# Patient Record
Sex: Male | Born: 1970
Health system: Southern US, Community
[De-identification: ages and names within clinical notes are randomized; demographics above are authoritative.]

## PROBLEM LIST (undated history)

## (undated) DIAGNOSIS — N492 Inflammatory disorders of scrotum: Secondary | ICD-10-CM

## (undated) HISTORY — DX: Inflammatory disorders of scrotum: N49.2

## (undated) HISTORY — PX: CATARACT EXTRACTION: SUR2

---

## 2006-05-20 HISTORY — PX: OTHER SURGICAL HISTORY: SHX169

## 2007-09-07 DIAGNOSIS — I10 Essential (primary) hypertension: Secondary | ICD-10-CM | POA: Insufficient documentation

## 2009-03-28 DIAGNOSIS — E663 Overweight: Secondary | ICD-10-CM | POA: Insufficient documentation

## 2009-09-08 DIAGNOSIS — D485 Neoplasm of uncertain behavior of skin: Secondary | ICD-10-CM | POA: Insufficient documentation

## 2009-09-08 DIAGNOSIS — A630 Anogenital (venereal) warts: Secondary | ICD-10-CM | POA: Insufficient documentation

## 2010-01-12 ENCOUNTER — Ambulatory Visit: Payer: Self-pay | Admitting: Family Medicine

## 2011-05-21 HISTORY — PX: ABSCESS DRAINAGE: SHX1119

## 2013-01-01 ENCOUNTER — Ambulatory Visit: Payer: Self-pay | Admitting: Family Medicine

## 2013-03-02 LAB — HM COLONOSCOPY

## 2013-05-13 ENCOUNTER — Inpatient Hospital Stay: Payer: Self-pay | Admitting: Urology

## 2013-05-13 ENCOUNTER — Ambulatory Visit: Payer: Self-pay | Admitting: Urology

## 2013-05-13 LAB — CBC WITH DIFFERENTIAL/PLATELET
Basophil %: 0.6 %
Eosinophil %: 0.2 %
HCT: 46.8 % (ref 40.0–52.0)
HGB: 15.9 g/dL (ref 13.0–18.0)
Lymphocyte #: 1.5 10*3/uL (ref 1.0–3.6)
MCH: 30.7 pg (ref 26.0–34.0)
MCHC: 33.9 g/dL (ref 32.0–36.0)
MCV: 91 fL (ref 80–100)
Monocyte #: 2.3 x10 3/mm — ABNORMAL HIGH (ref 0.2–1.0)
Monocyte %: 6.7 %
Neutrophil #: 30 10*3/uL — ABNORMAL HIGH (ref 1.4–6.5)
RDW: 12.4 % (ref 11.5–14.5)

## 2013-05-13 LAB — COMPREHENSIVE METABOLIC PANEL
Alkaline Phosphatase: 110 U/L
Anion Gap: 7 (ref 7–16)
BUN: 16 mg/dL (ref 7–18)
Bilirubin,Total: 1.9 mg/dL — ABNORMAL HIGH (ref 0.2–1.0)
Calcium, Total: 9.3 mg/dL (ref 8.5–10.1)
Chloride: 101 mmol/L (ref 98–107)
Creatinine: 1.64 mg/dL — ABNORMAL HIGH (ref 0.60–1.30)
EGFR (African American): 58 — ABNORMAL LOW
Glucose: 140 mg/dL — ABNORMAL HIGH (ref 65–99)
Osmolality: 272 (ref 275–301)
Potassium: 3.7 mmol/L (ref 3.5–5.1)
Sodium: 134 mmol/L — ABNORMAL LOW (ref 136–145)

## 2013-05-14 LAB — BASIC METABOLIC PANEL
Anion Gap: 4 — ABNORMAL LOW (ref 7–16)
Creatinine: 1.4 mg/dL — ABNORMAL HIGH (ref 0.60–1.30)
EGFR (African American): >60
EGFR (Non-African Amer.): >60
Glucose: 108 mg/dL — ABNORMAL HIGH (ref 65–99)
Potassium: 3.5 mmol/L (ref 3.5–5.1)

## 2013-05-14 LAB — CBC WITH DIFFERENTIAL/PLATELET
Basophil %: 0.3 %
HCT: 38.8 % — ABNORMAL LOW (ref 40.0–52.0)
HGB: 13.3 g/dL (ref 13.0–18.0)
Lymphocyte #: 1.7 10*3/uL (ref 1.0–3.6)
MCH: 31 pg (ref 26.0–34.0)
Monocyte %: 10.7 %
Neutrophil #: 18.8 10*3/uL — ABNORMAL HIGH (ref 1.4–6.5)
Neutrophil %: 80.9 %
Platelet: 199 10*3/uL (ref 150–440)
WBC: 23.2 10*3/uL — ABNORMAL HIGH (ref 3.8–10.6)

## 2013-05-15 LAB — CBC WITH DIFFERENTIAL/PLATELET
Basophil #: 0.1 10*3/uL (ref 0.0–0.1)
Eosinophil #: 0.2 10*3/uL (ref 0.0–0.7)
Eosinophil %: 1.4 %
HCT: 37.9 % — ABNORMAL LOW (ref 40.0–52.0)
HGB: 13 g/dL (ref 13.0–18.0)
Lymphocyte #: 1.7 10*3/uL (ref 1.0–3.6)
MCHC: 34.2 g/dL (ref 32.0–36.0)
MCV: 91 fL (ref 80–100)
Monocyte #: 2.1 x10 3/mm — ABNORMAL HIGH (ref 0.2–1.0)
Monocyte %: 12.3 %
Neutrophil #: 12.9 10*3/uL — ABNORMAL HIGH (ref 1.4–6.5)
Neutrophil %: 76 %
Platelet: 209 10*3/uL (ref 150–440)
RDW: 13 % (ref 11.5–14.5)
WBC: 17 10*3/uL — ABNORMAL HIGH (ref 3.8–10.6)

## 2013-05-15 LAB — BASIC METABOLIC PANEL
BUN: 15 mg/dL (ref 7–18)
Calcium, Total: 8.3 mg/dL — ABNORMAL LOW (ref 8.5–10.1)
Glucose: 105 mg/dL — ABNORMAL HIGH (ref 65–99)
Potassium: 3.8 mmol/L (ref 3.5–5.1)
Sodium: 136 mmol/L (ref 136–145)

## 2013-05-16 ENCOUNTER — Ambulatory Visit: Payer: Self-pay | Admitting: Urology

## 2013-05-16 LAB — CBC WITH DIFFERENTIAL/PLATELET
Basophil #: 0.1 10*3/uL (ref 0.0–0.1)
Eosinophil %: 2 %
HCT: 40.2 % (ref 40.0–52.0)
Lymphocyte %: 9.4 %
MCH: 30.3 pg (ref 26.0–34.0)
MCHC: 33.6 g/dL (ref 32.0–36.0)
Monocyte #: 1.6 x10 3/mm — ABNORMAL HIGH (ref 0.2–1.0)
Neutrophil #: 10.8 10*3/uL — ABNORMAL HIGH (ref 1.4–6.5)
Neutrophil %: 76.7 %
Platelet: 238 10*3/uL (ref 150–440)
RBC: 4.45 10*6/uL (ref 4.40–5.90)

## 2013-05-16 LAB — BASIC METABOLIC PANEL
BUN: 11 mg/dL (ref 7–18)
Calcium, Total: 8.8 mg/dL (ref 8.5–10.1)
Chloride: 104 mmol/L (ref 98–107)
Co2: 28 mmol/L (ref 21–32)
Sodium: 137 mmol/L (ref 136–145)

## 2013-05-17 LAB — CBC WITH DIFFERENTIAL/PLATELET
Eosinophil #: 0.3 10*3/uL (ref 0.0–0.7)
Eosinophil %: 2 %
HGB: 13.1 g/dL (ref 13.0–18.0)
Lymphocyte #: 1.6 10*3/uL (ref 1.0–3.6)
Lymphocyte %: 10.5 %
MCH: 29.8 pg (ref 26.0–34.0)
MCHC: 33.2 g/dL (ref 32.0–36.0)
Monocyte #: 1.6 x10 3/mm — ABNORMAL HIGH (ref 0.2–1.0)
Monocyte %: 10.8 %
Neutrophil #: 11.3 10*3/uL — ABNORMAL HIGH (ref 1.4–6.5)
Neutrophil %: 76.3 %
RBC: 4.39 10*6/uL — ABNORMAL LOW (ref 4.40–5.90)
RDW: 12.5 % (ref 11.5–14.5)

## 2013-05-17 LAB — BASIC METABOLIC PANEL
Anion Gap: 5 — ABNORMAL LOW (ref 7–16)
BUN: 11 mg/dL (ref 7–18)
Co2: 28 mmol/L (ref 21–32)
Creatinine: 1.03 mg/dL (ref 0.60–1.30)
EGFR (Non-African Amer.): >60
Potassium: 4.2 mmol/L (ref 3.5–5.1)

## 2013-05-18 LAB — WOUND CULTURE

## 2013-05-21 LAB — WOUND CULTURE

## 2013-05-24 DIAGNOSIS — N492 Inflammatory disorders of scrotum: Secondary | ICD-10-CM

## 2013-05-24 HISTORY — DX: Inflammatory disorders of scrotum: N49.2

## 2013-11-12 LAB — BASIC METABOLIC PANEL
BUN: 12 mg/dL (ref 4–21)
CREATININE: 1 mg/dL (ref 0.6–1.3)
Glucose: 94 mg/dL
Potassium: 4.3 mmol/L (ref 3.4–5.3)
Sodium: 138 mmol/L (ref 137–147)

## 2013-11-12 LAB — CBC AND DIFFERENTIAL
HCT: 47 % (ref 41–53)
Hemoglobin: 16.2 g/dL (ref 13.5–17.5)
Platelets: 206 10*3/uL (ref 150–399)
WBC: 8.2 10^3/mL

## 2013-11-12 LAB — HEPATIC FUNCTION PANEL
ALT: 32 U/L (ref 10–40)
AST: 26 U/L (ref 14–40)

## 2013-11-12 LAB — TSH: TSH: 1.59 u[IU]/mL (ref 0.41–5.90)

## 2013-12-07 ENCOUNTER — Ambulatory Visit: Payer: Self-pay | Admitting: General Surgery

## 2013-12-07 ENCOUNTER — Ambulatory Visit: Payer: Self-pay | Admitting: Family Medicine

## 2013-12-07 ENCOUNTER — Encounter: Payer: Self-pay | Admitting: *Deleted

## 2013-12-07 ENCOUNTER — Ambulatory Visit (INDEPENDENT_AMBULATORY_CARE_PROVIDER_SITE_OTHER): Payer: 59 | Admitting: General Surgery

## 2013-12-07 ENCOUNTER — Other Ambulatory Visit: Payer: Self-pay | Admitting: General Surgery

## 2013-12-07 ENCOUNTER — Encounter: Payer: Self-pay | Admitting: General Surgery

## 2013-12-07 VITALS — BP 120/82 | HR 76 | Temp 97.4°F | Resp 14 | Ht 72.0 in | Wt 287.0 lb

## 2013-12-07 DIAGNOSIS — K3580 Unspecified acute appendicitis: Secondary | ICD-10-CM

## 2013-12-07 HISTORY — PX: APPENDECTOMY: SHX54

## 2013-12-07 LAB — CBC WITH DIFFERENTIAL/PLATELET
BASOS PCT: 1.7 %
Basophil #: 0.2 10*3/uL — ABNORMAL HIGH (ref 0.0–0.1)
Eosinophil #: 0.2 10*3/uL (ref 0.0–0.7)
Eosinophil %: 1.9 %
HCT: 50.2 % (ref 40.0–52.0)
HGB: 16.4 g/dL (ref 13.0–18.0)
LYMPHS ABS: 2.2 10*3/uL (ref 1.0–3.6)
LYMPHS PCT: 18.6 %
MCH: 29.9 pg (ref 26.0–34.0)
MCHC: 32.8 g/dL (ref 32.0–36.0)
MCV: 91 fL (ref 80–100)
MONO ABS: 1 x10 3/mm (ref 0.2–1.0)
MONOS PCT: 8.4 %
NEUTROS ABS: 8.1 10*3/uL — AB (ref 1.4–6.5)
Neutrophil %: 69.4 %
PLATELETS: 184 10*3/uL (ref 150–440)
RBC: 5.49 10*6/uL (ref 4.40–5.90)
RDW: 12.7 % (ref 11.5–14.5)
WBC: 11.6 10*3/uL — ABNORMAL HIGH (ref 3.8–10.6)

## 2013-12-07 LAB — COMPREHENSIVE METABOLIC PANEL
ALBUMIN: 3.9 g/dL (ref 3.4–5.0)
Alkaline Phosphatase: 90 U/L
Anion Gap: 8 (ref 7–16)
BUN: 12 mg/dL (ref 7–18)
Bilirubin,Total: 1.1 mg/dL — ABNORMAL HIGH (ref 0.2–1.0)
Calcium, Total: 9.1 mg/dL (ref 8.5–10.1)
Chloride: 102 mmol/L (ref 98–107)
Co2: 27 mmol/L (ref 21–32)
Creatinine: 1.28 mg/dL (ref 0.60–1.30)
EGFR (African American): >60
Glucose: 89 mg/dL (ref 65–99)
Osmolality: 273 (ref 275–301)
Potassium: 4.1 mmol/L (ref 3.5–5.1)
SGOT(AST): 25 U/L (ref 15–37)
SGPT (ALT): 33 U/L (ref 12–78)
Sodium: 137 mmol/L (ref 136–145)
TOTAL PROTEIN: 8.4 g/dL — AB (ref 6.4–8.2)

## 2013-12-07 NOTE — Patient Instructions (Addendum)
Laparoscopic Appendectomy Appendectomy is surgery to remove the appendix. Laparoscopic surgery uses several small cuts (incisions) instead of one large incision. Laparoscopic surgery offers a shorter recovery time and less discomfort. LET YOUR CAREGIVER KNOW ABOUT:  Allergies to food or medicine.  Medicines taken, including vitamins, dietary supplements, herbs, eyedrops, over-the-counter medicines, and creams.  Use of steroids (by mouth or creams).  Previous problems with anesthetics or numbing medicines.  History of bleeding problems or blood clots.  Previous surgery.  Other health problems, including diabetes, heart problems, lung problems, and kidney problems.  Possibility of pregnancy, if this applies. RISKS AND COMPLICATIONS  Infection. A germ starts growing in the wound. This can usually be treated with antibiotics. In some cases, the wound will need to be opened and cleaned.  Bleeding.  Damage to other organs.  Sores (abscesses).  Chronic pain at the incision sites. This is defined as pain that lasts for more than 3 months.  Blood clots in the legs that may rarely travel to the lungs.  Infection in the lungs (pneumonia). BEFORE THE PROCEDURE Appendectomy is usually performed immediately after an inflamed appendix (appendicitis) is diagnosed. No preparation is necessary ahead of this procedure. PROCEDURE  You will be given medicine that makes you sleep (general anesthetic). After you are asleep, a flexible tube (catheter) may be inserted into your bladder to drain your urine during surgery. The tube is removed before you wake up after surgery. When you are asleep, carbondioxide gas will be used to inflate your abdomen. This will allow your surgeon to see inside your abdomen and perform your surgery. Three small incisions will be made in your abdomen. Your surgeon will insert a thin, lighted tube (laparoscope) through one of the incisions. Your surgeon will look through the  laparoscope while performing the surgery. Other tools will be inserted through the other incisions. Laparoscopic procedures may not be appropriate when:  There is major scarring from a previous surgery.  The patient has bleeding disorders.  A pregnancy is near term.  There are other conditions which make the laparoscopic procedure impossible, such as an advanced infection or a ruptured appendix. If your surgeon feels it is not safe to continue with the laparoscopic procedure, he or she will perform an open surgery instead. This gives the surgeon a larger view and more space to work. Open surgery requires a longer recovery time. After your appendix is removed, your incisions will be closed with stitches (sutures) or skin adhesive. AFTER THE PROCEDURE You will be taken to a recovery room. When the anesthesia has worn off, you will be returned to your hospital room. You will be given pain medicines to keep you comfortable. Ask your caregiver how long your hospital stay will be. Document Released: 12/19/2003 Document Revised: 07/29/2011 Document Reviewed: 11/13/2010 [facility] Patient Information 2015 [place], [place]. This information is not intended to replace advice given to you by your health care provider. Make sure you discuss any questions you have with your health care provider.  Patient is scheduled for surgery at [facility] on 12/07/13. He is to report to [facility] day surgery after leaving the office today. Patient is aware of instructions.

## 2013-12-07 NOTE — Progress Notes (Signed)
Patient ID: Travis Abbott, male   DOB: [birth year], 43 y.o.   MRN: [id number]  Chief Complaint  Patient presents with  . Abdominal Pain    HPI Travis Abbott is a 43 y.o. male. here today for abdominal pain. Patient was seen in Dr. Caryn Section office yesterday. He had a ct scan done 12/07/13.  Patient states he has been having abdominal pain for since yesterday morning in her right lower quadrant.He first thought it was gas.  The patient experienced discomfort generally in the abdomen and then it localized in the right lower quadrant. HPI  Past Medical History  Diagnosis Date  . Hypertension     Past Surgical History  Procedure Laterality Date  . Abscess drainage  2013    No family history on file.  Social History History  Substance Use Topics  . Smoking status: Former Smoker -- 1.00 packs/day for 10 years    Types: Cigarettes  . Smokeless tobacco: Former Systems developer  . Alcohol Use: Yes    Allergies  Allergen Reactions  . Losartan Potassium     headaches    Current Outpatient Prescriptions  Medication Sig Dispense Refill  . aspirin 325 MG tablet Take 325 mg by mouth every 6 (six) hours as needed.      . candesartan (ATACAND) 16 MG tablet Take 16 mg by mouth daily.      . fluticasone (FLONASE) 50 MCG/ACT nasal spray Place into both nostrils daily.      . indomethacin (INDOCIN) 50 MG capsule Take 50 mg by mouth 3 (three) times daily as needed.      . loratadine (CLARITIN) 10 MG tablet Take 10 mg by mouth daily.      . Vitamin D, Ergocalciferol, (DRISDOL) 50000 UNITS CAPS capsule Take 50,000 Units by mouth every 7 (seven) days.       No current facility-administered medications for this visit.    Review of Systems Review of Systems  Constitutional: Negative.   Respiratory: Negative.   Cardiovascular: Negative.   Gastrointestinal: Positive for abdominal pain. Negative for nausea, vomiting, diarrhea, constipation, blood in stool, abdominal distention, anal bleeding and rectal  pain.    Blood pressure 120/82, pulse 76, temperature 97.4 F (36.3 C), resp. rate 14, height 6' (1.829 m), weight 287 lb (130.182 kg).  Physical Exam Physical Exam  Constitutional: He is oriented to person, place, and time. He appears well-developed and well-nourished.  Eyes: Conjunctivae are normal. No scleral icterus.  Neck: Neck supple.  Cardiovascular: Normal rate, regular rhythm and normal heart sounds.   Pulmonary/Chest: Effort normal and breath sounds normal.  Abdominal: Soft. Normal appearance and bowel sounds are normal. There is no hepatomegaly. There is tenderness in the right lower quadrant.  Neurological: He is alert and oriented to person, place, and time.  Skin: Skin is warm and dry.    Data Reviewed CT of the abdomen and pelvis, CBC/competence metabolic panel. Case reviewed with PCP.  Assessment    Acute appendicitis.    Plan    Indications for operative resection were reviewed. Plans for a laparoscopic resection with the possibility of an open procedure were reviewed.    Patient is scheduled for surgery at [facility] on 12/07/13. He is to report to [facility] day surgery after leaving the office today. Patient is aware of instructions.   PCP: Nolene Bernheim 12/08/2013, 1:10 PM

## 2013-12-08 ENCOUNTER — Encounter: Payer: Self-pay | Admitting: General Surgery

## 2013-12-08 DIAGNOSIS — K3580 Unspecified acute appendicitis: Secondary | ICD-10-CM | POA: Insufficient documentation

## 2013-12-09 LAB — PATHOLOGY REPORT

## 2013-12-13 ENCOUNTER — Encounter: Payer: Self-pay | Admitting: General Surgery

## 2013-12-14 ENCOUNTER — Encounter: Payer: Self-pay | Admitting: General Surgery

## 2013-12-14 ENCOUNTER — Ambulatory Visit (INDEPENDENT_AMBULATORY_CARE_PROVIDER_SITE_OTHER): Payer: Self-pay | Admitting: General Surgery

## 2013-12-14 VITALS — BP 132/74 | HR 76 | Resp 14 | Ht 72.0 in | Wt 279.0 lb

## 2013-12-14 DIAGNOSIS — K3580 Unspecified acute appendicitis: Secondary | ICD-10-CM

## 2013-12-14 NOTE — Progress Notes (Signed)
Patient ID: Travis Abbott, male   DOB: 08-Jan-1971, 43 y.o.   MRN: [id number]  Chief Complaint  Patient presents with  . Routine Post Op    appendix    HPI Travis Abbott is a 43 y.o. male here today for his post op laparoscopic appendectomy completed on 12/07/13. Patient states he is doing well. He has noted some abdominal discomfort with epigastric distress when eating greasy hamburgers or onions. No difficulty with bowel function. No urinary problems. HPI  Past Medical History  Diagnosis Date  . Hypertension     Past Surgical History  Procedure Laterality Date  . Abscess drainage  2013  . Appendectomy  12/07/13    No family history on file.  Social History History  Substance Use Topics  . Smoking status: Former Smoker -- 1.00 packs/day for 10 years    Types: Cigarettes  . Smokeless tobacco: Former Systems developer  . Alcohol Use: Yes    Allergies  Allergen Reactions  . Losartan Potassium     headaches    Current Outpatient Prescriptions  Medication Sig Dispense Refill  . aspirin 325 MG tablet Take 325 mg by mouth every 6 (six) hours as needed.      . candesartan (ATACAND) 16 MG tablet Take 16 mg by mouth daily.      . fluticasone (FLONASE) 50 MCG/ACT nasal spray Place into both nostrils daily.      . indomethacin (INDOCIN) 50 MG capsule Take 50 mg by mouth 3 (three) times daily as needed.      . loratadine (CLARITIN) 10 MG tablet Take 10 mg by mouth daily.      . Vitamin D, Ergocalciferol, (DRISDOL) 50000 UNITS CAPS capsule Take 50,000 Units by mouth every 7 (seven) days.       No current facility-administered medications for this visit.    Review of Systems Review of Systems  Constitutional: Negative.   Respiratory: Negative.   Cardiovascular: Negative.     Blood pressure 132/74, pulse 76, resp. rate 14, height 6' (1.829 m), weight 279 lb (126.554 kg).  Physical Exam Physical Exam  Constitutional: He is oriented to person, place, and time. He appears  well-developed and well-nourished.  Cardiovascular: Normal rate, regular rhythm and normal heart sounds.   Pulmonary/Chest: Effort normal and breath sounds normal.  Abdominal: Soft. Normal appearance and bowel sounds are normal.  Port sites look clean and healing well.   Neurological: He is alert and oriented to person, place, and time.  Skin: Skin is warm and dry.    Data Reviewed Pathology showed evidence of acute appendicitis. No malignancy.  Assessment    Doing well status post appendectomy.  Mild dietary intolerance will likely resolve with time.     Plan    Care was strenuous activity was reviewed. Careful lifting technique demonstrated. Followup examination as needed.      PCP: Nolene Bernheim 12/14/2013, 9:21 PM

## 2013-12-14 NOTE — Patient Instructions (Signed)
Patient to return as needed. 

## 2013-12-20 ENCOUNTER — Ambulatory Visit: Payer: 59 | Admitting: General Surgery

## 2014-09-09 NOTE — H&P (Signed)
Subjective/Chief Complaint Progressive Scrotal Swelling   History of Present Illness 44 y.o. DWM s/p Bilateral Scrotal Vasectomy 05/07/2013 by Dr. Edrick Oh for desired sterilization. Developed progressive scrotal edema/pain on 05/09/2013. Pain is described as sharp and pulsatile (9/10, constant) requiring oxycodone 27m po q6hrs for relief.  Pt noted yellow discharge in his underwear since 05/10/2013.  Feels a little "dehydrated" due to not wanting to get up to get something to drink due to the discomfort.  Denies F/C, dysuria, freq/urg, N/V.  [facility]  Pt was afebrile with a tachycardia (pulse 126), but nL BP.  WBC elevated at 34.1k with 88% neutrophils. Creatinine elevated at 1.64.  Scrotal [place] thickening of the scrotal wall consistent with edema - no identifiable abscess, no intrascrotal masses, nL testes with tiny left epididymal cyst without hyperemia.   Past Medical Health Hypertension, Allergies   Past Med/Surgical Hx:  Hypertension:   Vasectomy:   ALLERGIES:  No Known Allergies:   HOME MEDICATIONS: Medication Instructions Status  candesartan 16 mg oral tablet 1 tab(s) orally once a day Active  acetaminophen-oxyCODONE 325 mg-5 mg oral tablet 1 tab(s) orally every 6 hours Active    Medications Pt reports taking 2 oxycodone every 6 hrs. Pt reports loratidine qDay   Family and Social History:  Family History Hypertension  Denies Prostate Cancer   Social History negative tobacco, negative ETOH, Former 1.5 ppd smoker x 12 yrs - d/c'd x 8 yrs   Place of Living Home   Review of Systems:  Subjective/Chief Complaint Scrotal swelling and pain   Fever/Chills No   Cough No   Sputum No   Abdominal Pain No   Diarrhea No   Constipation Yes  last BM 2 days ago   Nausea/Vomiting No   SOB/DOE No   Chest Pain No   Dysuria No   Tolerating Diet Yes   Medications/Allergies Reviewed Medications/Allergies reviewed   Physical  Exam:  GEN well developed, well nourished, no acute distress   HEENT pink conjunctivae, hearing intact to voice, dry oral mucosa, Oropharynx clear, good dentition, [place]/AT, anicteric, EOMI   NECK supple  No masses  thyroid not tender  trachea midline   RESP normal resp effort  clear BS  no use of accessory muscles   CARD regular rate  no murmur  no carotid bruits  No LE edema  no JVD  no Rub   ABD denies tenderness  no liver/spleen enlargement  no hernia  soft  normal BS  no Adominal Mass   GU nL circ phallus, marked scrotal edema/erythema, tender, with small amount of expressable purulence from the scrotal incision sites b/L   LYMPH negative neck, no inguinal adenopathy   EXTR negative edema   SKIN No rashes, No ulcers, skin turgor good, mild erythema extending from the scrotum to the mons with left tenderness   NEURO negative rigidity, negative tremor, follows commands, non-focal   PSYCH A+O to time, place, person, good insight, pleasant and cooperative   Lab Results: Hepatic:  25-Dec-14 10:21   Bilirubin, Total  1.9  Alkaline Phosphatase 110 (45-117 NOTE: New Reference Range 04/09/13)  SGPT (ALT) 23  SGOT (AST) 17  Total Protein, Serum  8.5  Albumin, Serum 3.5  Routine Chem:  25-Dec-14 10:21   Glucose, Serum  140  BUN 16  Creatinine (comp)  1.64  Sodium, Serum  134  Potassium, Serum 3.7  Chloride, Serum 101  CO2, Serum 26  Calcium (Total), Serum  9.3  Osmolality (calc) 272  eGFR (African American)  58  eGFR (Non-African American)  50 (eGFR values <51m/min/1.73 m2 may be an indication of chronic kidney disease (CKD). Calculated eGFR is useful in patients with stable renal function. The eGFR calculation will not be reliable in acutely ill patients when serum creatinine is changing rapidly. It is not useful in  patients on dialysis. The eGFR calculation may not be applicable to patients at the low and high extremes of body sizes, pregnant women, and vegetarians.)   Anion Gap 7  Routine Hem:  25-Dec-14 10:21   WBC (CBC)  34.1  RBC (CBC) 5.17  Hemoglobin (CBC) 15.9  Hematocrit (CBC) 46.8  Platelet Count (CBC) 231  MCV 91  MCH 30.7  MCHC 33.9  RDW 12.4  Neutrophil % 88.0  Lymphocyte % 4.5  Monocyte % 6.7  Eosinophil % 0.2  Basophil % 0.6  Neutrophil #  30.0  Lymphocyte # 1.5  Monocyte #  2.3  Eosinophil # 0.1  Basophil #  0.2 (Result(s) reported on 13 May 2013 at 11:28AM.)   Radiology Results: [place]    25-Dec-14 12:15, UKoreaTesticle  [place]Testicle  REASON FOR EXAM:    pain redness, severe swelling, hi white count  COMMENTS:       PROCEDURE: [place] - UKoreaTESTICULAR W/DOPPLER  - May 13 2013 12:15PM     CLINICAL DATA:  Trickle scrotal pain and swelling with elevated  white blood cell count.    EXAM:  SCROTAL ULTRASOUND    DOPPLER ULTRASOUND OF THE TESTICLES    TECHNIQUE:  Complete ultrasound examination of the testicles, epididymis, and  other scrotal structures was performed. Color and spectral Doppler  ultrasound were also utilized to evaluate blood flow to the  testicles.    COMPARISON:  None.    FINDINGS:  Right testicle    Measurements: 4.4 x 3.1 x 3.1 cm.. No mass or microlithiasis  visualized.    Left testicle    Measurements: 4.2 x 3.1 x 2.8 cm. No mass or microlithiasis  visualized.    Right epididymis:  Normal in size and appearance.    Left epididymis: There is a tiny left epididymal cyst measuring 3.6  x 3.3 x 3.9 cm.    Hydrocele:  None visualized.    Varicocele:  None visualized.    Pulsed Doppler interrogation of both testesdemonstrates the testes  and the epididymal structures demonstrate normal vascularity.     IMPRESSION:  1. The testes and epididymal structures are normal in appearance.  There is a tiny epididymal cyst on the left. There is no hyperemia  of these structures. No intra scrotal mass is demonstrated and there  is no hydrocele or varicocele.  2. There is clinical thickening and  erythema of the scrotum. The  ultrasound appearance of the scrotal wall is that of edema. No  discrete scrotal abscess is demonstrated .      Electronically Signed    By: David  JMartinique   On: 05/13/2013 12:17         Verified By: DAVID A. JMartinique M.D., MD  LabUnknown:  PACS Image    Assessment/Admission Diagnosis 1. Scrotal cellulitis s/p b/L Scrotal Vasectomy 2. AKI - due to dehydration due to persistent severe pain inhibiting po fluid intake 3. HTN - well-controlled on meds   Plan Admit for IV Abx's/IV hydration  1. Unasyn 3g IV q6hrs 2. D5 1/2 NS at 1223mhr 3. Strict I's/O's 4. Scrotal Elevation 5. Warm  compresses to the scrotal incision sites   Electronic Signatures: Darcella Cheshire (MD)  (Signed 25-Dec-14 14:50)  Authored: CHIEF COMPLAINT and HISTORY, PAST MEDICAL/SURGIAL HISTORY, ALLERGIES, HOME MEDICATIONS, OTHER MEDICATIONS, FAMILY AND SOCIAL HISTORY, REVIEW OF SYSTEMS, PHYSICAL EXAM, LABS, Radiology, ASSESSMENT AND PLAN   Last Updated: 25-Dec-14 14:50 by Darcella Cheshire (MD)

## 2014-09-10 NOTE — Discharge Summary (Signed)
PATIENT NAME:  Travis Abbott, Travis Abbott MR#:  [id number] DATE OF BIRTH:  October 19, 1970  DATE OF ADMISSION:  05/13/2013  DATE OF DISCHARGE:  05/18/2013  PRINCIPAL DIAGNOSIS: Scrotal abscess.   PROCEDURE: Incision and drainage of scrotal abscess.   INDICATION: The patient is a 44 year old gentleman who underwent recent vasectomy. He developed superficial infection at the incision sites bilaterally. There was an approximate 5-day delay between the onset of symptoms and his contact with the office. He had significant swelling, pain and discomfort of the scrotal area. He had poor oral intake as a result of the discomfort. He ultimately presented to the Emergency Room for further evaluation. He was noted to have a serum creatinine elevated to 1.64. He was also noted to have white blood cell count elevation to 34.1. An ultrasound was obtained in the Emergency Room, demonstrating no evidence of abscess. He was admitted for IV antibiotic therapy and hydration.   HOSPITAL COURSE: Travis Abbott was admitted through the Emergency Room on 05/13/2013 with elevated white blood cell count and superficial cellulitis of the scrotum. There was no evidence of abscess at that time. He was placed on Unasyn IV antibiotic therapy with IV hydration. He had moderate reduction in his white blood cell count to 23.2 by his second day of hospitalization. On examination, a small amount of purulence was expressible from the incision sites bilaterally. There was no definitive evidence of abscess at that time. He was continued on IV antibiotic therapy. On December 27, there was an identifiable area suspicious for abscess, predominantly in the right hemiscrotum. He underwent repeat scrotal ultrasound, demonstrating a sizable abscess. He underwent incision and drainage by Dr. Sharlette Dense. Packing was then applied. He had continued reduction of his white blood cell count to 14.8. He had improvement in his serum creatinine to 1.03. He had significant improvement  in pain and discomfort. He had some reduction in the scrotal erythema. However, moderate edema persisted. He was able to ambulate without difficulty. He was tolerating a general diet. He remained afebrile, vital signs stable throughout the remainder of his hospitalization. He was instructed as to the proper technique for wound care with wet-to-dry saline packing and covering with clean, dry gauze. He continued to improve. Subsequent cultures demonstrated findings consistent with staph aureus. It was sensitive to most antibiotics. He was subsequently discharged to home on 05/18/2013, with instructions to continue with the dressing changes on a 3-time-a-day basis. He was discharged on a 10-day course of Bactrim double strength twice daily. He was also discharged with Percocet 1 every 6 hours as needed for pain. He is to follow up in one week for re-evaluation. He is to notify us if there are any further problems or questions in the interim.    ____________________________ Denice Bors Jacqlyn Larsen, MD bsc:mr D: 05/18/2013 14:50:00 ET T: 05/18/2013 19:59:44 ET JOB#: [id number]  cc: Denice Bors. Jacqlyn Larsen, MD, <Dictator> Denice Bors Leeona Mccardle MD ELECTRONICALLY SIGNED 05/24/2013 13:37

## 2014-09-10 NOTE — Op Note (Signed)
PATIENT NAME:  Travis Abbott, Travis Abbott MR#:  [id number] DATE OF BIRTH:  08/28/1970  DATE OF PROCEDURE:  12/07/2013  PREOPERATIVE DIAGNOSIS: Acute appendicitis.   POSTOPERATIVE DIAGNOSIS:  Acute appendicitis.   OPERATIVE PROCEDURE: Laparoscopic appendectomy.   SURGEON: Robert Bellow, MD.   ANESTHESIA: General endotracheal under Dr. Boston Service.   ESTIMATED BLOOD LOSS: Less than 25 mL.   CLINICAL NOTE: This 44 year old male presented with just over 24-hour history of abdominal pain. CT showed evidence of appendiceal swelling and periappendiceal inflammation. White blood cell count was mildly elevated at 11,200. Clinical exam was consistent with acute appendicitis. The patient received Invanz prior to the procedure.   OPERATIVE NOTE: The patient underwent general endotracheal anesthesia without difficulty. Hair had previously been removed from the abdomen with clippers. In Trendelenburg position, a Veress needle was placed through a transumbilical incision. After assuring intra-abdominal location with the hanging drop test, the abdomen was insufflated with CO2 at initially 10 mmHg pressure. This was later increased to 12 mm to provide better exposure. A 10 mm step port was expanded and inspection showed no evidence of injury from initial port placement. A 10 mm step port was placed in the hypogastrium and a 12 mm Xcel port placed in the left lower quadrant outside the edge of the rectus fascia. The periappendiceal tissue did show inflammation and swelling.   The appendix was freed and the bleeding noted was from the medial aspect of the mesoappendix. The appendix was divided from the base of the cecum with a blue Endo GIA cartridge. Good hemostasis was noted. The mesoappendix was divided with a white vascular cartridge. The appendix was placed in an Endo Catch bag and then while drawing it through the 12 mm port site, the bag ruptured. A  2nd bag was utilized and the appendix was extracted without  incident. After re-establishing pneumoperitoneum, a small amount of blood perhaps 10 mL or so was noted in the area of the appendix. This was irrigated with the Stryker irrigator. The mesoappendix appeared hemostatic and the area of inflammatory bleeding, near antimesenteric sail of the terminal ileum was unremarkable. Final irrigation again showed no bleeding and the 12 mm port site was then closed with an 0 PDS suture placed with transfacial suture passer. The pneumoperitoneum was released and the hypogastric port removed under direct vision. The fascia at the umbilicus was closed with an 0 PDS figure-of-eight suture. Skin incisions were closed with 4-0 Vicryl subcuticular suture. Benzoin, Steri-Strips, Telfa and Tegaderm dressings were applied.   The patient tolerated the procedure and was taken to the recovery room in stable condition.    ____________________________ Robert Bellow, MD jwb:ds D: 12/07/2013 21:43:04 ET T: 12/07/2013 21:56:55 ET JOB#: [id number]  cc: Robert Bellow, MD, <Dictator> Kirstie Peri. Caryn Section, MD Jonnatan Amedeo Kinsman MD ELECTRONICALLY SIGNED 12/15/2013 9:49

## 2014-09-10 NOTE — H&P (Signed)
PATIENT NAME:  Travis Abbott, Travis Abbott MR#:  [id number] DATE OF BIRTH:  1971-05-13  DATE OF ADMISSION:  12/07/2013  ADMISSION DIAGNOSIS: Acute appendicitis.   CLINICAL NOTE: This 44 year old male was with his usual health until approximately 7:00 a.m. yesterday morning, when he noticed the onset of diffuse abdominal pain. Over the next several hours, this settled in the right lower quadrant and has persisted since the onset. No improvement with recent bowel movement. No nausea or vomiting. No fever, chills.   CT scan completed earlier today showed evidence of thickening of the appendix and periappendiceal inflammation. White blood cell count was mildly elevated at 11,600 with a normal differential. Hemoglobin 16.4.   PHYSICAL EXAMINATION:   HEAD AND NECK: Unremarkable.  CHEST: Clear to auscultation.  CARDIAC: Regular rhythm without murmur or gallop.  ABDOMEN: Obese and soft. Normal bowel sounds. Moderate tenderness in the right lower quadrant. No peritoneal irritation, guarding or referred pain.   PAST MEDICAL HISTORY: Notable for essential hypertension.    MEDICATIONS:  Include aspirin, Atacand 16 mg daily, Flonase 15 mcg daily, indomethacin 50 mg p.o. t.i.d., Claritin 10 mg p.o. q.d., vitamin D daily.   MEDICAL ALLERGIES CONSIST OF LOSARTAN.   IMPRESSION: Acute appendicitis.   PLAN: The patient will be admitted to day surgery and undergo laparoscopic appendectomy. Plans are for post-Operating Room overnight observation.    ____________________________ Robert Bellow, MD jwb:cs D: 12/07/2013 15:38:49 ET T: 12/07/2013 16:18:06 ET JOB#: [id number]  cc: Robert Bellow, MD, <Dictator> Kirstie Peri. Caryn Section, MD Favor Amedeo Kinsman MD ELECTRONICALLY SIGNED 12/07/2013 20:11

## 2014-10-30 ENCOUNTER — Other Ambulatory Visit: Payer: Self-pay | Admitting: Family Medicine

## 2014-11-22 ENCOUNTER — Telehealth: Payer: Self-pay | Admitting: Family Medicine

## 2014-11-22 NOTE — Telephone Encounter (Signed)
Patient returned call. Patient advised as below. Patient states the cut is more like a scratch and he had the nurse at work look at it and wrap it up. Patient advised to keep the scratch clean.

## 2014-11-22 NOTE — Telephone Encounter (Signed)
Tried calling patient. No answer. Left message to call back. Last Tdap was 03/28/2009. Patient does not need to get another booster right now. How is his finger? What did he cut it on?  Does patient need to be seen for finger cut?

## 2014-11-22 NOTE — Telephone Encounter (Signed)
Pt called .  Cut finger at work.  Does not need stiches but needs to know if he needs tet. Vaccine.  Please call back and leave message at [phone]

## 2015-04-11 DIAGNOSIS — R208 Other disturbances of skin sensation: Secondary | ICD-10-CM | POA: Insufficient documentation

## 2015-04-11 DIAGNOSIS — M25519 Pain in unspecified shoulder: Secondary | ICD-10-CM | POA: Insufficient documentation

## 2015-04-11 DIAGNOSIS — J309 Allergic rhinitis, unspecified: Secondary | ICD-10-CM | POA: Insufficient documentation

## 2015-04-11 DIAGNOSIS — R809 Proteinuria, unspecified: Secondary | ICD-10-CM | POA: Insufficient documentation

## 2015-04-11 DIAGNOSIS — N529 Male erectile dysfunction, unspecified: Secondary | ICD-10-CM | POA: Insufficient documentation

## 2015-04-11 DIAGNOSIS — E559 Vitamin D deficiency, unspecified: Secondary | ICD-10-CM | POA: Insufficient documentation

## 2015-04-12 ENCOUNTER — Encounter: Payer: Self-pay | Admitting: Family Medicine

## 2015-04-12 ENCOUNTER — Ambulatory Visit (INDEPENDENT_AMBULATORY_CARE_PROVIDER_SITE_OTHER): Payer: 59 | Admitting: Family Medicine

## 2015-04-12 VITALS — BP 144/88 | HR 71 | Temp 97.9°F | Resp 16 | Ht 72.75 in | Wt 296.0 lb

## 2015-04-12 DIAGNOSIS — I10 Essential (primary) hypertension: Secondary | ICD-10-CM

## 2015-04-12 DIAGNOSIS — R202 Paresthesia of skin: Secondary | ICD-10-CM | POA: Diagnosis not present

## 2015-04-12 DIAGNOSIS — E663 Overweight: Secondary | ICD-10-CM

## 2015-04-12 DIAGNOSIS — E559 Vitamin D deficiency, unspecified: Secondary | ICD-10-CM | POA: Diagnosis not present

## 2015-04-12 DIAGNOSIS — Z86010 Personal history of colonic polyps: Secondary | ICD-10-CM | POA: Insufficient documentation

## 2015-04-12 DIAGNOSIS — Z23 Encounter for immunization: Secondary | ICD-10-CM | POA: Diagnosis not present

## 2015-04-12 DIAGNOSIS — Z0000 Encounter for general adult medical examination without abnormal findings: Secondary | ICD-10-CM

## 2015-04-12 NOTE — Progress Notes (Signed)
Patient: Travis Abbott, Male    DOB: [birth year], 44 y.o.   MRN: [id number] Visit Date: 04/12/2015  Today's Provider: Lelon Huh, MD   Chief Complaint  Patient presents with  . Annual Exam  . Hypertension    follow up  . Erectile Dysfunction    follow up   Subjective:    Annual physical exam Travis Abbott is a 44 y.o. male who presents today for health maintenance and complete physical. He feels poorly. He has sinus problems going on. He reports never exercising other than lifting on the job . He reports he is sleeping well.  -----------------------------------------------------------------  Hypertension, follow-up:  BP Readings from Last 3 Encounters:  12/14/13 132/74  12/07/13 120/82    He was last seen for hypertension 11 months ago.  BP at that visit was  150/96. Management since that visit includes no changes. He reports good compliance with treatment. He is not having side effects.  He is not exercising. He is adherent to low salt diet.   Outside blood pressures are not being checked. He is experiencing fatigue.  Patient denies chest pain, chest pressure/discomfort, claudication, dyspnea, exertional chest pressure/discomfort, irregular heart beat and lower extremity edema.   Cardiovascular risk factors include hypertension and male gender.  Use of agents associated with hypertension: none.     Weight trend: increasing steadily Wt Readings from Last 3 Encounters:  12/14/13 279 lb (126.554 kg)  12/07/13 287 lb (130.182 kg)    Current diet: in general, a "healthy" diet    ------------------------------------------------------------------------ Allergies He reports his allergies have flared up the last few weeks, is using OTC Generic flonase and is going to start on loratadine.   Neuropathy Work up last year discovered very low vitamin D. He had evaluation by Dr. Melrose Nakayama and was prescribed gabapentin and B12 supplements. Patient states numbness  and burning of feet are much better and he has not taken vitamins of gabapentin for several months.   Follow up Vitamin D Deficiency: Last office visit was 11/ months ago and no changes were made. Patient was advised to continue Vitamin D supplements 50,000units every week.  Today patient comes in stating he has been out of the Vitamin D supplements for several months. Patient declines any numbness.   Follow up Gouty Arthropathy: Current treatment includes Indomethacin as needed. Patient reports he has not had a gout flare up in several months.   Follow up Erectile Dysfunction: Last evaluation visit was 3 years ago and patient was started on Viagra. Patient states he no longer needs Viagra.   Review of Systems  Constitutional: Positive for fatigue. Negative for fever, chills and appetite change.  HENT: Positive for congestion, rhinorrhea and sinus pressure.   Eyes: Negative.   Respiratory: Negative for chest tightness, shortness of breath and wheezing.   Cardiovascular: Negative for chest pain and palpitations.  Gastrointestinal: Negative for nausea, vomiting and abdominal pain.  Endocrine: Negative.   Genitourinary: Negative.   Musculoskeletal: Negative.   Allergic/Immunologic: Negative.   Neurological: Negative.   Hematological: Negative.   Psychiatric/Behavioral: Negative.     Social History He  reports that he quit smoking about 7 years ago. His smoking use included Cigarettes. He has a 10 pack-year smoking history. He has quit using smokeless tobacco. He reports that he drinks alcohol. He reports that he does not use illicit drugs. Social History   Social History  . Marital Status: Single    Spouse Name: N/A  .  Number of Children: 1  . Years of Education: N/A   Occupational History  . Welder    Social History Main Topics  . Smoking status: Former Smoker -- 1.00 packs/day for 10 years    Types: Cigarettes    Quit date: 05/21/2007  . Smokeless tobacco: Former Systems developer  .  Alcohol Use: 0.0 oz/week    0 Standard drinks or equivalent per week     Comment: dinks 2 drinks per month  . Drug Use: No  . Sexual Activity: Not Asked   Other Topics Concern  . None   Social History Narrative    Patient Active Problem List   Diagnosis Date Noted  . History of adenomatous polyp of colon 04/12/2015  . Allergic rhinitis 04/11/2015  . ED (erectile dysfunction) of organic origin 04/11/2015  . Pain in joint, shoulder region 04/11/2015  . Burning sensation of the foot 04/11/2015  . Proteinuria 04/11/2015  . Vitamin D deficiency 04/11/2015  . Condyloma acuminatum 09/08/2009  . Neoplasm of uncertain behavior of skin 09/08/2009  . Gouty arthropathy 08/12/2009  . Overweight 03/28/2009  . Essential (primary) hypertension 09/07/2007    Past Surgical History  Procedure Laterality Date  . Abscess drainage  2013  . Appendectomy  12/07/13    Dr. Bary Castilla, [facility]  . Cataract extraction      Left eye: 03/02/2001.  Right eye: 04/07/2000  . Biopsy of left parotid mass  2008    Benign    Family History  Family Status  Relation Status Death Age  . Mother Alive   . Father Alive   . Brother Alive   . Son Alive   . Maternal Grandmother Deceased   . Maternal Grandfather Deceased   . Paternal Grandmother Deceased     died from complication of Dementia  . Paternal Grandfather Deceased    His family history includes Dementia in his paternal grandmother; Heart attack in his maternal grandfather; Hypertension in his mother; Kidney failure in his paternal grandfather; Skin cancer in his father; Stomach cancer in his maternal grandmother.    Allergies  Allergen Reactions  . Losartan Potassium     headaches    Previous Medications   CANDESARTAN (ATACAND) 16 MG TABLET    Take 1 tablet (16 mg total) by mouth daily.   INDOMETHACIN (INDOCIN) 50 MG CAPSULE    Take 50 mg by mouth 3 (three) times daily as needed.   LORATADINE (CLARITIN) 10 MG TABLET    Take 10 mg by mouth daily.     PSYLLIUM (REGULOID) 0.52 G CAPSULE    Take 2 capsules by mouth daily.   VITAMIN D, ERGOCALCIFEROL, (DRISDOL) 50000 UNITS CAPS CAPSULE    Take 50,000 Units by mouth every 7 (seven) days.    Patient Care Team: Birdie Sons, MD as PCP - General (Family Medicine) Robert Bellow, MD (General Surgery)     Objective:   Vitals: BP 144/88 mmHg  Pulse 71  Temp(Src) 97.9 F (36.6 C) (Oral)  Resp 16  Ht 6' 0.75" (1.848 m)  Wt 296 lb (134.265 kg)  BMI 39.32 kg/m2  SpO2 97%   Physical Exam   General Appearance:    Alert, cooperative, no distress, appears stated age. ebese  Head:    Normocephalic, without obvious abnormality, atraumatic  Eyes:    PERRL, conjunctiva/corneas clear, EOM's intact, fundi    benign, both eyes       Ears:    Normal TM's and external ear canals, both ears  Nose:   Nares normal, septum midline, mucosa normal, no drainage   or sinus tenderness  Throat:   Lips, mucosa, and tongue normal; teeth and gums normal  Neck:   Supple, symmetrical, trachea midline, no adenopathy;       thyroid:  No enlargement/tenderness/nodules; no carotid   bruit or JVD  Back:     Symmetric, no curvature, ROM normal, no CVA tenderness  Lungs:     Clear to auscultation bilaterally, respirations unlabored  Chest wall:    No tenderness or deformity  Heart:    Regular rate and rhythm, S1 and S2 normal, no murmur, rub   or gallop  Abdomen:     Soft, non-tender, bowel sounds active all four quadrants,    no masses, no organomegaly  Genitalia:    deferred  Rectal:    deferred  Extremities:   Extremities normal, atraumatic, no cyanosis or edema  Pulses:   2+ and symmetric all extremities  Skin:   Skin color, texture, turgor normal, no rashes or lesions  Lymph nodes:   Cervical, supraclavicular, and axillary nodes normal  Neurologic:   CNII-XII intact. Normal strength, sensation and reflexes      throughout    Depression Screen PHQ 2/9 Scores 04/12/2015  PHQ - 2 Score 0  PHQ- 9  Score 0      Assessment & Plan:     Routine Health Maintenance and Physical Exam  Exercise Activities and Dietary recommendations Goals    None      Immunization History  Administered Date(s) Administered  . Tdap 03/28/2009    Health Maintenance  Topic Date Due  . HIV Screening  03/28/1984  . INFLUENZA VACCINE  12/19/2014  . TETANUS/TDAP  03/29/2019      Discussed health benefits of physical activity, and encouraged him to engage in regular exercise appropriate for his age and condition.    --------------------------------------------------------------------  1. Annual physical exam  - Comprehensive metabolic panel - Lipid panel  2. Paresthesia of foot, unspecified laterality Much improved, but is now off of B12 and vitamin D supplements.   3. Essential (primary) hypertension Stable. Continue current medications.   - EKG 12-Lead  4. Overweight Reduce calories to lose weight  5. Vitamin D deficiency Current off of D supplements.  - VITAMIN D 25 Hydroxy (Vit-D Deficiency, Fractures)  6. Need for influenza vaccination  - Flu Vaccine QUAD 36+ mos IM

## 2015-04-12 NOTE — Patient Instructions (Signed)

## 2015-04-13 LAB — LIPID PANEL
CHOL/HDL RATIO: 4.3 ratio (ref 0.0–5.0)
Cholesterol, Total: 151 mg/dL (ref 100–199)
HDL: 35 mg/dL — ABNORMAL LOW (ref >39)
LDL CALC: 94 mg/dL (ref 0–99)
Triglycerides: 112 mg/dL (ref 0–149)
VLDL CHOLESTEROL CAL: 22 mg/dL (ref 5–40)

## 2015-04-13 LAB — COMPREHENSIVE METABOLIC PANEL
ALBUMIN: 4.4 g/dL (ref 3.5–5.5)
ALT: 42 IU/L (ref 0–44)
AST: 33 IU/L (ref 0–40)
Albumin/Globulin Ratio: 1.5 (ref 1.1–2.5)
Alkaline Phosphatase: 90 IU/L (ref 39–117)
BUN / CREAT RATIO: 12 (ref 9–20)
BUN: 14 mg/dL (ref 6–24)
Bilirubin Total: 0.6 mg/dL (ref 0.0–1.2)
CALCIUM: 9.7 mg/dL (ref 8.7–10.2)
CO2: 24 mmol/L (ref 18–29)
CREATININE: 1.2 mg/dL (ref 0.76–1.27)
Chloride: 103 mmol/L (ref 97–106)
GFR calc Af Amer: 83 mL/min/{1.73_m2} (ref >59)
GFR, EST NON AFRICAN AMERICAN: 72 mL/min/{1.73_m2} (ref >59)
GLOBULIN, TOTAL: 2.9 g/dL (ref 1.5–4.5)
Glucose: 100 mg/dL — ABNORMAL HIGH (ref 65–99)
Potassium: 5.4 mmol/L — ABNORMAL HIGH (ref 3.5–5.2)
SODIUM: 142 mmol/L (ref 136–144)
Total Protein: 7.3 g/dL (ref 6.0–8.5)

## 2015-04-13 LAB — VITAMIN D 25 HYDROXY (VIT D DEFICIENCY, FRACTURES): Vit D, 25-Hydroxy: 23.1 ng/mL — ABNORMAL LOW (ref 30.0–100.0)

## 2015-05-22 IMAGING — CT CT ABD-PELV W/ CM
2 of 5 series · 16 of 46 positions shown, 18 images · IV contrast (isovue)
Comparison: None.

CLINICAL DATA: Right lower quadrant and left lower quadrant pain
for 1 day, abdominal bloating

EXAM:
CT ABDOMEN AND PELVIS WITH CONTRAST
TECHNIQUE: Multidetector CT imaging of the abdomen and pelvis was performed
using the standard protocol following bolus administration of
intravenous contrast.
CONTRAST:  125 cc Isovue 370

[Series 2: routine abd pel with · axial · 0.95mm/px · z∈[-1080,-590]mm · 13 of 110 slices shown, 15 images]
[im 6/110  soft-tissue]
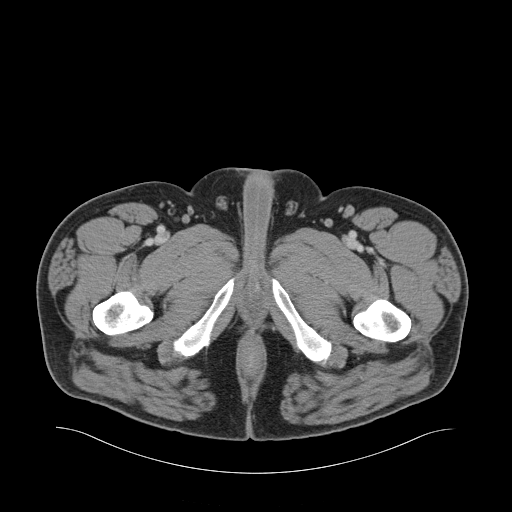
[im 6/110  bone]
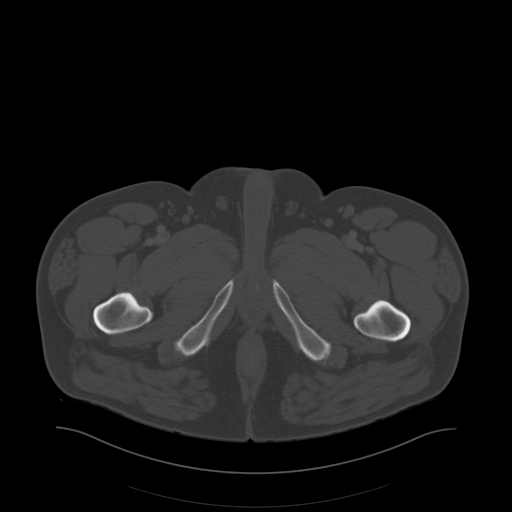
[im 17/110  soft-tissue]
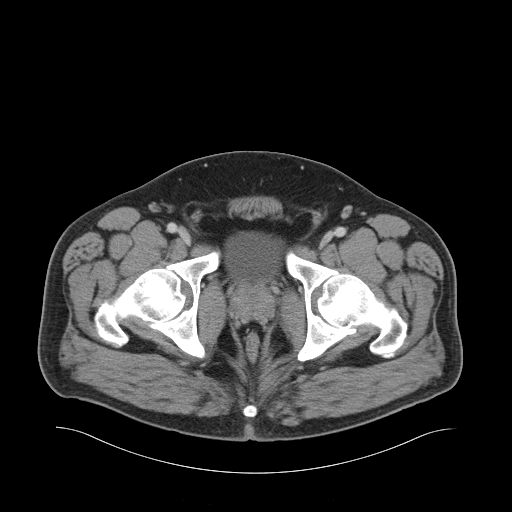
[im 22/110  soft-tissue]
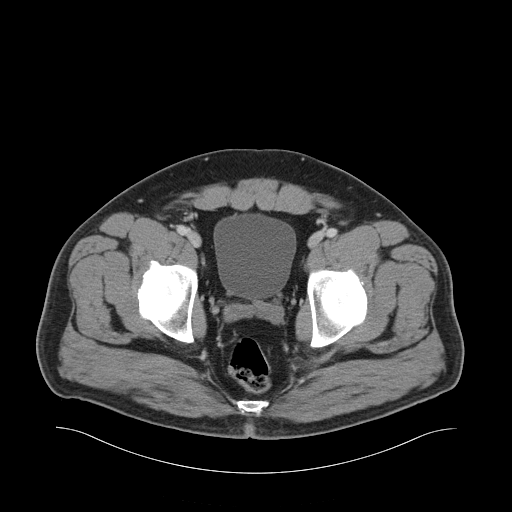
[im 33/110  soft-tissue]
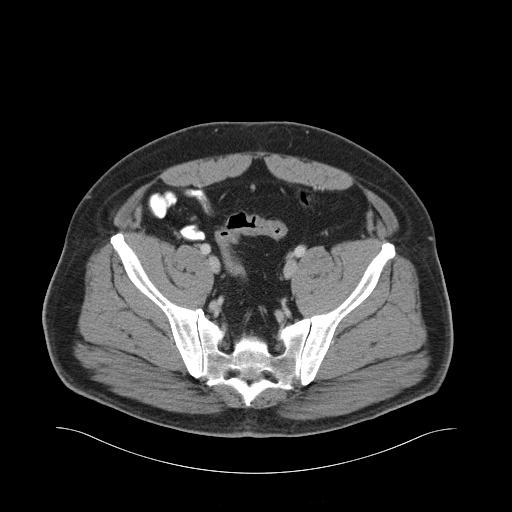
[im 39/110  soft-tissue]
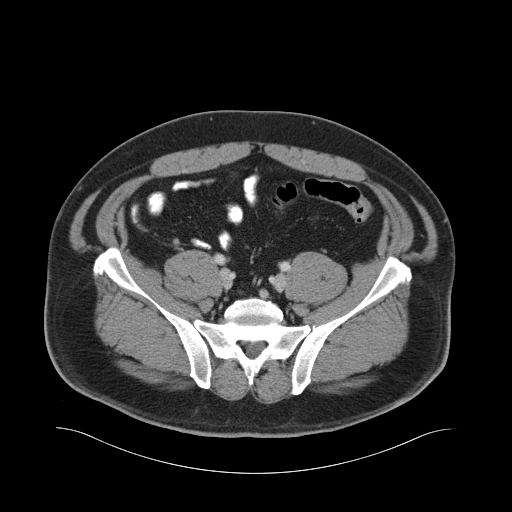
[im 50/110  soft-tissue]
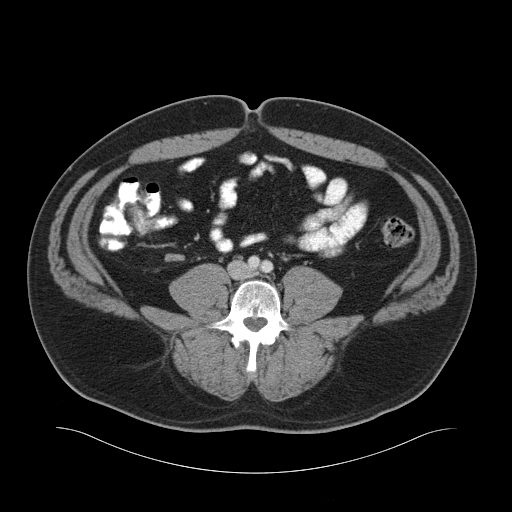
[im 55/110  soft-tissue]
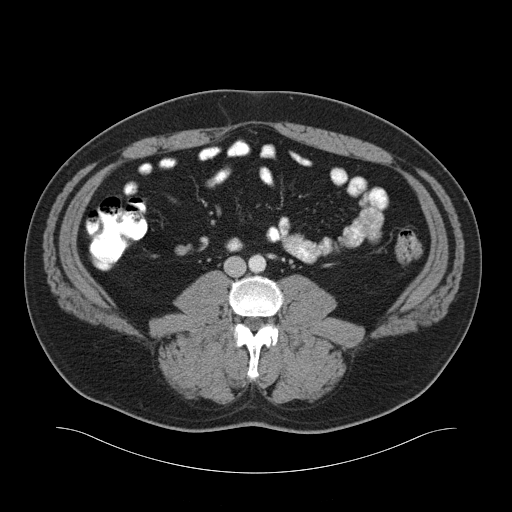
[im 60/110  soft-tissue]
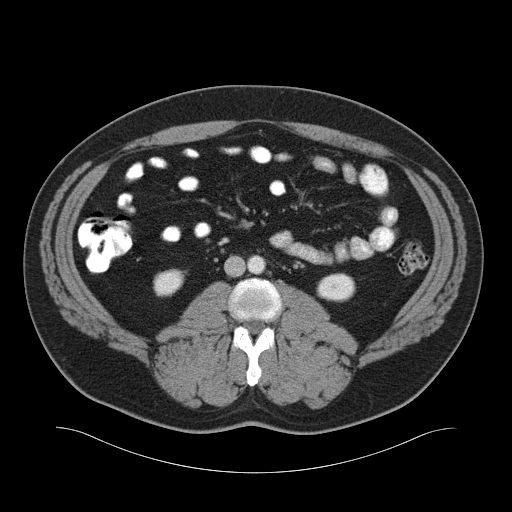
[im 71/110  soft-tissue]
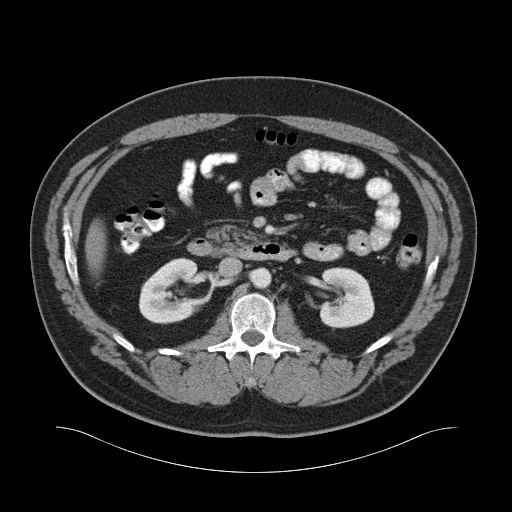
[im 71/110  bone]
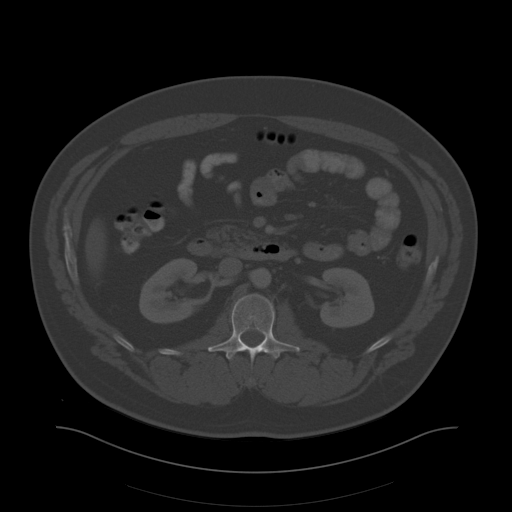
[im 77/110  soft-tissue]
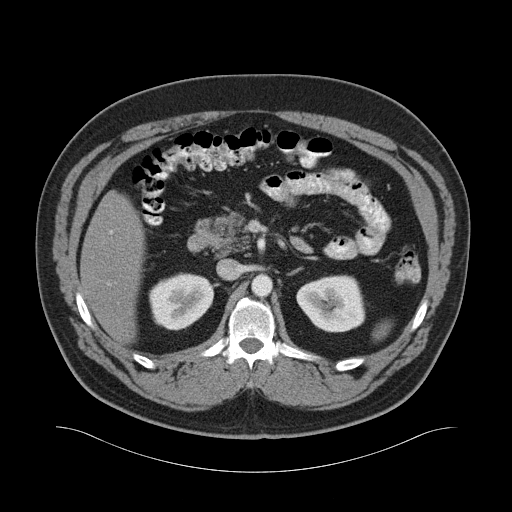
[im 88/110  soft-tissue]
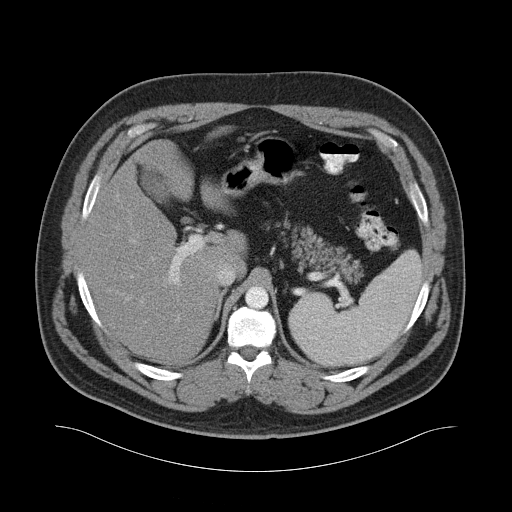
[im 93/110  soft-tissue]
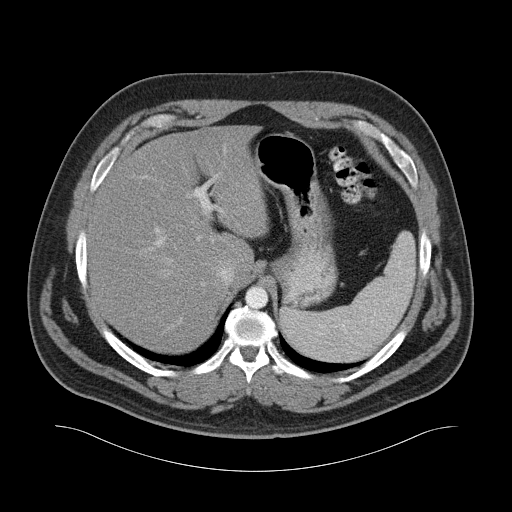
[im 104/110  soft-tissue]
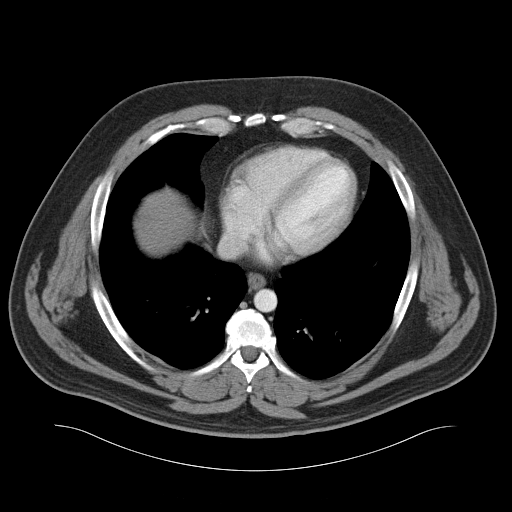

[Series 6: cor routine abd pel with · coronal · 0.93mm/px · 3 of 175 slices shown]
[im 59/175  soft-tissue]
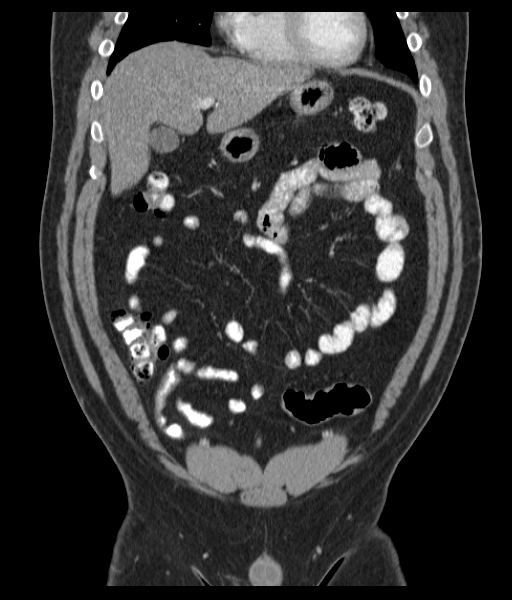
[im 78/175  soft-tissue]
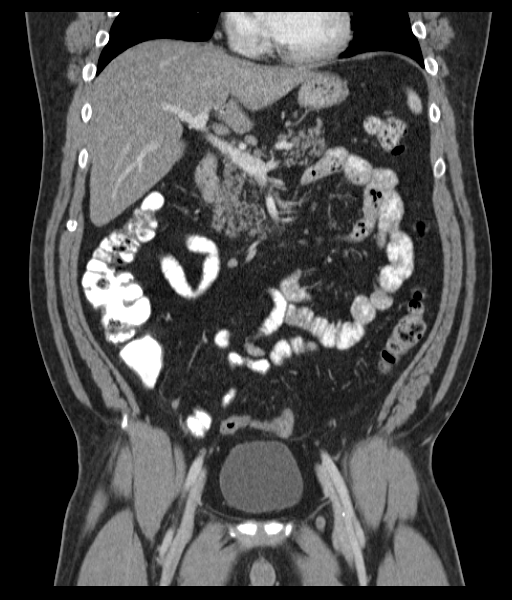
[im 97/175  soft-tissue]
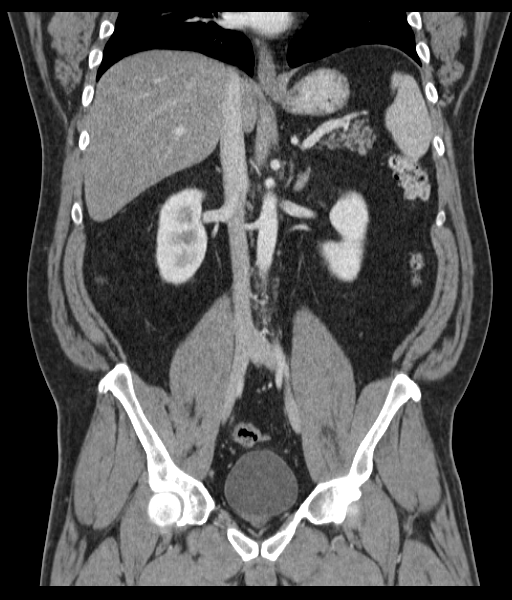

[16 of 46 positions shown; findings below may reference images not displayed]

FINDINGS: The lung bases are clear. The liver is rather low in attenuation
which may indicate fatty infiltration. Correlation with liver
function tests is recommended. No calcified gallstones are seen. The
pancreas is normal in size and the pancreatic duct is not dilated.
The adrenal glands and spleen are unremarkable. The stomach is
decompressed. The kidneys enhance with no calculus or mass and on
delayed images the pelvocaliceal systems are unremarkable with a
probable tiny cyst emanating from the upper pole of the left kidney.
The proximal ureters are normal in caliber. The abdominal aorta is
normal in caliber. No adenopathy is seen.

The appendix is slightly prominent within the right lower quadrant
measuring up to 10 mm in diameter with a small amount of
periappendiceal strandiness consistent with acute appendicitis. Mild
edema is present at the appendiceal orifice at the base of the
cecum. The urinary bladder is unremarkable. The prostate is normal
in size. The colon is decompressed. The terminal ileum is
unremarkable. The lumbar vertebrae are in normal alignment with
normal intervertebral disc spaces.
IMPRESSION: 1. Prominent appendix with mild periappendiceal strandiness very
suspicious for acute appendicitis. No complicating features are
seen.
2. Suspect mild fatty infiltration of the liver. Correlate with
LFTs.

## 2015-06-08 ENCOUNTER — Encounter: Payer: Self-pay | Admitting: Family Medicine

## 2015-06-08 ENCOUNTER — Ambulatory Visit (INDEPENDENT_AMBULATORY_CARE_PROVIDER_SITE_OTHER): Payer: 59 | Admitting: Family Medicine

## 2015-06-08 ENCOUNTER — Other Ambulatory Visit: Payer: Self-pay

## 2015-06-08 VITALS — BP 138/84 | HR 83 | Temp 98.2°F | Resp 16 | Wt 302.4 lb

## 2015-06-08 DIAGNOSIS — J40 Bronchitis, not specified as acute or chronic: Secondary | ICD-10-CM

## 2015-06-08 MED ORDER — BENZONATATE 100 MG PO CAPS: ORAL_CAPSULE | ORAL | Status: DC

## 2015-06-08 MED ORDER — AZITHROMYCIN 250 MG PO TABS: ORAL_TABLET | ORAL | Status: DC

## 2015-06-08 NOTE — Progress Notes (Signed)
Subjective:     Patient ID: Travis Abbott, male   DOB: 09-04-1970, 45 y.o.   MRN: [id number]  HPI  Chief Complaint  Patient presents with  . URI    symptoms X 2.5 weeks. Patient reports he has tried Mucinex with mild relief.   States sinuses are clear and he is left with spells of coughing where he will lose his breath: "I've had walking pneumonia before." Tdap in 2010.   Review of Systems  Constitutional: Negative for fever and chills.       Objective:   Physical Exam  Constitutional: He appears well-developed and well-nourished. No distress.  Ears: T.M's intact without inflammation Throat: no tonsillar enlargement or exudate Neck: no cervical adenopathy Lungs: clear     Assessment:    1. Bronchitis - azithromycin (ZITHROMAX) 250 MG tablet; Two pills the first day then one pill daily for 4 days  Dispense: 6 tablet; Refill: 0 - benzonatate (TESSALON) 100 MG capsule; One or two 3 x day as needed for cough  Dispense: 21 capsule; Refill: 0    Plan:    Encourage fluid intake.

## 2015-06-08 NOTE — Patient Instructions (Signed)
Continue with increased fluids.

## 2015-07-31 ENCOUNTER — Other Ambulatory Visit: Payer: Self-pay | Admitting: Family Medicine

## 2015-09-18 ENCOUNTER — Encounter: Payer: Self-pay | Admitting: Family Medicine

## 2015-09-18 ENCOUNTER — Ambulatory Visit (INDEPENDENT_AMBULATORY_CARE_PROVIDER_SITE_OTHER): Payer: 59 | Admitting: Family Medicine

## 2015-09-18 VITALS — BP 130/80 | HR 80 | Temp 98.6°F | Resp 16 | Ht 72.75 in | Wt 300.0 lb

## 2015-09-18 DIAGNOSIS — H6983 Other specified disorders of Eustachian tube, bilateral: Secondary | ICD-10-CM | POA: Diagnosis not present

## 2015-09-18 MED ORDER — FLUTICASONE PROPIONATE 50 MCG/ACT NA SUSP: 2.0000 | Freq: Every day | NASAL | Status: AC

## 2015-09-18 NOTE — Progress Notes (Signed)
Patient: Travis Abbott Male    DOB: Aug 05, 1970   45 y.o.   MRN: [id number] Visit Date: 09/18/2015  Today's Provider: Lelon Huh, MD   Chief Complaint  Patient presents with  . Ear Fullness   Subjective:      Bilateral ear congestion for 2 weeks. Patient had sinus congestion 2 weeks ago and then wears ears stopped-up. He states he had a bad cold with nasal and sinus congestion before his ears started bothering him. All other cold symptoms have resolved. Ears are not at all painful, but he feels like he is in a well.    Ear Fullness  There is pain in both ears. This is a new problem. The current episode started 1 to 4 weeks ago (2 weeks). The problem occurs constantly. The problem has been unchanged. There has been no fever. The patient is experiencing no pain. Pertinent negatives include no abdominal pain, coughing, diarrhea, ear discharge, headaches, hearing loss, neck pain, rash, rhinorrhea, sore throat or vomiting. Treatments tried: mucinex. The treatment provided moderate relief. There is no history of a chronic ear infection, hearing loss or a tympanostomy tube.      Allergies  Allergen Reactions  . Losartan Potassium     headaches   Previous Medications   CANDESARTAN (ATACAND) 16 MG TABLET    Take 1 tablet (16 mg total) by mouth daily.   CHOLECALCIFEROL (VITAMIN D3) 5000 UNITS TABS    Take by mouth.   INDOMETHACIN (INDOCIN) 50 MG CAPSULE    TAKE ONE CAPSULE BY MOUTH 3 TIMES A DAY AS NEEDED FOR PAIN   LORATADINE (CLARITIN) 10 MG TABLET    Take 10 mg by mouth daily.   PSYLLIUM (REGULOID) 0.52 G CAPSULE    Take 2 capsules by mouth daily.   ZYCLARA PUMP [id number] % CREA    APPLICATION APPLY ON THE SKIN DAILY    Review of Systems  Constitutional: Negative for fever, chills and appetite change.  HENT: Negative for ear discharge, hearing loss, rhinorrhea and sore throat.        Bilateral ear congestion x2 weeks  Respiratory: Negative for cough, chest tightness, shortness  of breath and wheezing.   Cardiovascular: Negative for chest pain and palpitations.  Gastrointestinal: Negative for nausea, vomiting, abdominal pain and diarrhea.  Musculoskeletal: Negative for neck pain.  Skin: Negative for rash.  Neurological: Negative for headaches.    Social History  Substance Use Topics  . Smoking status: Former Smoker -- 1.00 packs/day for 10 years    Types: Cigarettes    Quit date: 05/21/2007  . Smokeless tobacco: Former Systems developer  . Alcohol Use: 0.0 oz/week    0 Standard drinks or equivalent per week     Comment: dinks 2 drinks per month   Objective:   BP 130/80 mmHg  Pulse 80  Temp(Src) 98.6 F (37 C) (Oral)  Resp 16  Ht 6' 0.75" (1.848 m)  Wt 300 lb (136.079 kg)  BMI 39.85 kg/m2  SpO2 98%  Physical Exam  General Appearance:    Alert, cooperative, no distress  HENT:   bilateral TM fluid noted, neck without nodes, throat normal without erythema or exudate, sinuses nontender and nasal mucosa pale and congested  Eyes:    PERRL, conjunctiva/corneas clear, EOM's intact       Lungs:     Clear to auscultation bilaterally, respirations unlabored  Heart:    Regular rate and rhythm  Neurologic:   Awake, alert, oriented x  3. No apparent focal neurological           defect.            Assessment & Plan:     1. Eustachian tube dysfunction, bilateral He has been out of Flonase for quite a while. Will start back on fluticasone nasal spray.  - fluticasone (FLONASE) 50 MCG/ACT nasal spray; Place 2 sprays into both nostrils daily. Reported on 09/18/2015  Dispense: 16 g; Refill: 1  Call if any fevers or ear pain.       Lelon Huh, MD  [facility]

## 2015-11-09 ENCOUNTER — Other Ambulatory Visit: Payer: Self-pay | Admitting: Family Medicine

## 2016-07-17 ENCOUNTER — Encounter: Payer: Self-pay | Admitting: Family Medicine

## 2016-07-17 ENCOUNTER — Ambulatory Visit (INDEPENDENT_AMBULATORY_CARE_PROVIDER_SITE_OTHER): Payer: 59 | Admitting: Family Medicine

## 2016-07-17 VITALS — BP 126/74 | HR 80 | Temp 98.7°F | Resp 16 | Ht 71.0 in | Wt 287.0 lb

## 2016-07-17 DIAGNOSIS — K921 Melena: Secondary | ICD-10-CM | POA: Diagnosis not present

## 2016-07-17 DIAGNOSIS — Z0000 Encounter for general adult medical examination without abnormal findings: Secondary | ICD-10-CM | POA: Diagnosis not present

## 2016-07-17 DIAGNOSIS — I10 Essential (primary) hypertension: Secondary | ICD-10-CM | POA: Diagnosis not present

## 2016-07-17 DIAGNOSIS — E559 Vitamin D deficiency, unspecified: Secondary | ICD-10-CM

## 2016-07-17 DIAGNOSIS — Z86010 Personal history of colonic polyps: Secondary | ICD-10-CM

## 2016-07-17 DIAGNOSIS — Z800 Family history of malignant neoplasm of digestive organs: Secondary | ICD-10-CM | POA: Diagnosis not present

## 2016-07-17 NOTE — Patient Instructions (Signed)
It is recommended to engage in 150 minutes of moderate exercise every week.   

## 2016-07-17 NOTE — Progress Notes (Signed)
Patient: Travis Abbott, Male    DOB: 1971/03/15, 46 y.o.   MRN: [id number] Visit Date: 07/17/2016  Today's Provider: Lelon Huh, MD   Chief Complaint  Patient presents with  . Annual Exam  . Hypertension   Subjective:    Annual physical exam Travis Abbott is a 46 y.o. male who presents today for health maintenance and complete physical. He feels well. He reports exercising 3 times a week. He reports he is sleeping well.    Hypertension, follow-up:  BP Readings from Last 3 Encounters:  07/17/16 126/74  09/18/15 130/80  06/08/15 138/84    He was last seen for hypertension 1 years ago.  BP at that visit was 144/88. Management since that visit includes no changes. He reports good compliance with treatment. He is not having side effects.  He is exercising. He is adherent to low salt diet.   Outside blood pressures are not being checked. Patient denies chest pressure/discomfort, exertional chest pressure/discomfort and lower extremity edema.     Weight trend: decreasing steadily Wt Readings from Last 3 Encounters:  07/17/16 287 lb (130.2 kg)  09/18/15 300 lb (136.1 kg)  06/08/15 (!) 302 lb 6.4 oz (137.2 kg)    Current diet: well balanced   Blood in stool He reports that over the last few months he has noticed blood in stool a couple of times each week. Prior to that he noticed blood rarely, every two or three months. There is no pain, burning or rectal itching associated with blood, and has had no constipation or other change in bowel habits. His last colonoscopy by Dr. Tiffany Kocher was in Oct 2014 and was advised to repeat in 5 years due to adenomatous polyps.   Review of Systems  Constitutional: Negative.   HENT: Negative.   Eyes: Negative.   Respiratory: Negative.   Gastrointestinal: Positive for blood in stool and rectal pain.       Has hemorrhoids.   Endocrine: Negative.   Genitourinary: Negative.   Musculoskeletal: Negative.   Skin: Negative.     Allergic/Immunologic: Negative.   Neurological: Negative.   Hematological: Negative.  Negative for adenopathy.  Psychiatric/Behavioral: Negative.     Social History      He  reports that he quit smoking about 9 years ago. His smoking use included Cigarettes. He has a 10.00 pack-year smoking history. He has quit using smokeless tobacco. He reports that he drinks alcohol. He reports that he does not use drugs.       Social History   Social History  . Marital status: Single    Spouse name: N/A  . Number of children: 1  . Years of education: N/A   Occupational History  . Welder    Social History Main Topics  . Smoking status: Former Smoker    Packs/day: 1.00    Years: 10.00    Types: Cigarettes    Quit date: 05/21/2007  . Smokeless tobacco: Former Systems developer  . Alcohol use 0.0 oz/week     Comment: dinks 2 drinks per month  . Drug use: No  . Sexual activity: Not Asked   Other Topics Concern  . None   Social History Narrative  . None    Past Medical History:  Diagnosis Date  . Abscess of scrotum 05/24/2013     Patient Active Problem List   Diagnosis Date Noted  . History of adenomatous polyp of colon 04/12/2015  . Allergic rhinitis 04/11/2015  . ED (  erectile dysfunction) of organic origin 04/11/2015  . Pain in joint, shoulder region 04/11/2015  . Burning sensation of the foot 04/11/2015  . Proteinuria 04/11/2015  . Vitamin D deficiency 04/11/2015  . Condyloma acuminatum 09/08/2009  . Neoplasm of uncertain behavior of skin 09/08/2009  . Gouty arthropathy 08/12/2009  . Overweight 03/28/2009  . Essential (primary) hypertension 09/07/2007    Past Surgical History:  Procedure Laterality Date  . ABSCESS DRAINAGE  2013  . APPENDECTOMY  12/07/13   Dr. Bary Castilla, [facility]  . Biopsy of left parotid mass  2008   Benign  . CATARACT EXTRACTION     Left eye: 03/02/2001.  Right eye: 04/07/2000    Family History        Family Status  Relation Status  . Mother Alive  . Father  Alive  . Brother Alive  . Son Alive  . Maternal Grandmother Deceased  . Maternal Grandfather Deceased  . Paternal Grandmother Deceased   died from complication of Dementia  . Paternal Grandfather Deceased        His family history includes Dementia in his paternal grandmother; Heart attack in his maternal grandfather; Hypertension in his mother; Kidney failure in his paternal grandfather; Skin cancer in his father; Stomach cancer in his maternal grandmother.     Allergies  Allergen Reactions  . Losartan Potassium     headaches     Current Outpatient Prescriptions:  .  candesartan (ATACAND) 16 MG tablet, TAKE 1 TABLET BY MOUTH DAILY, Disp: 90 tablet, Rfl: 3 .  fluticasone (FLONASE) 50 MCG/ACT nasal spray, Place 2 sprays into both nostrils daily. Reported on 09/18/2015, Disp: 16 g, Rfl: 1 .  indomethacin (INDOCIN) 50 MG capsule, TAKE ONE CAPSULE BY MOUTH 3 TIMES A DAY AS NEEDED FOR PAIN, Disp: 30 capsule, Rfl: 3 .  loratadine (CLARITIN) 10 MG tablet, Take 10 mg by mouth daily., Disp: , Rfl:  .  psyllium (REGULOID) 0.52 G capsule, Take 2 capsules by mouth daily., Disp: , Rfl:  .  ZYCLARA PUMP [id number] % CREA, APPLICATION APPLY ON THE SKIN DAILY, Disp: , Rfl: 2 .  Cholecalciferol (VITAMIN D3) 5000 units TABS, Take by mouth., Disp: , Rfl:    Patient Care Team: Birdie Sons, MD as PCP - General (Family Medicine) Robert Bellow, MD (General Surgery)      Objective:   Vitals: BP 126/74 (BP Location: Right Arm, Patient Position: Sitting, Cuff Size: Large)   Pulse 80   Temp 98.7 F (37.1 C)   Resp 16   Ht 5\' 11"  (1.803 m)   Wt 287 lb (130.2 kg)   BMI 40.03 kg/m    Physical Exam   General Appearance:    Alert, cooperative, no distress, appears stated age, obsee  Head:    Normocephalic, without obvious abnormality, atraumatic  Eyes:    PERRL, conjunctiva/corneas clear, EOM's intact, fundi    benign, both eyes       Ears:    Normal TM's and external ear canals, both ears  Nose:    Nares normal, septum midline, mucosa normal, no drainage   or sinus tenderness  Throat:   Lips, mucosa, and tongue normal; teeth and gums normal  Neck:   Supple, symmetrical, trachea midline, no adenopathy;       thyroid:  No enlargement/tenderness/nodules; no carotid   bruit or JVD  Back:     Symmetric, no curvature, ROM normal, no CVA tenderness  Lungs:     Clear to auscultation bilaterally, respirations unlabored  Chest wall:    No tenderness or deformity  Heart:    Regular rate and rhythm, S1 and S2 normal, no murmur, rub   or gallop  Abdomen:     Soft, non-tender, bowel sounds active all four quadrants,    no masses, no organomegaly  Genitalia:    deferred  Rectal:    deferred  Extremities:   Extremities normal, atraumatic, no cyanosis or edema  Pulses:   2+ and symmetric all extremities  Skin:   Skin color, texture, turgor normal, no rashes or lesions  Lymph nodes:   Cervical, supraclavicular, and axillary nodes normal  Neurologic:   CNII-XII intact. Normal strength, sensation and reflexes      throughout    Depression Screen PHQ 2/9 Scores 07/17/2016 04/12/2015  PHQ - 2 Score 0 0  PHQ- 9 Score 0 0      Assessment & Plan:     Routine Health Maintenance and Physical Exam  Exercise Activities and Dietary recommendations Goals    None      Immunization History  Administered Date(s) Administered  . Influenza,inj,Quad PF,36+ Mos 04/12/2015  . Tdap 03/28/2009    Health Maintenance  Topic Date Due  . HIV Screening  03/28/1984  . INFLUENZA VACCINE  12/19/2015  . COLONOSCOPY  03/02/2018  . TETANUS/TDAP  03/29/2019     Discussed health benefits of physical activity, and encouraged him to engage in regular exercise appropriate for his age and condition.     1. Annual physical exam  - Comprehensive metabolic panel - Lipid panel  2. Essential (primary) hypertension Well controlled.  Continue current medications.   - Lipid panel - EKG 12-Lead  3. Vitamin  D deficiency Is currently off of vitamin d supplements, but had no adverse effects when he was taking them.  - VITAMIN D 25 Hydroxy (Vit-D Deficiency, Fractures)  4. History of adenomatous polyp of colon  - Ambulatory referral to Gastroenterology  5. Blood in stool Considering family history of colon cancer and history of adenomatous polyps, will refer back to Dr. Tiffany Kocher to consider follow up colonoscopy.  - Ambulatory referral to Gastroenterology  6. Family history of colon cancer  - Ambulatory referral to Gastroenterology   Lelon Huh, MD  [facility]

## 2016-07-18 LAB — LIPID PANEL
CHOL/HDL RATIO: 5.6 ratio — AB (ref 0.0–5.0)
CHOLESTEROL TOTAL: 157 mg/dL (ref 100–199)
HDL: 28 mg/dL — ABNORMAL LOW (ref >39)
LDL Calculated: 93 mg/dL (ref 0–99)
TRIGLYCERIDES: 180 mg/dL — AB (ref 0–149)
VLDL Cholesterol Cal: 36 mg/dL (ref 5–40)

## 2016-07-18 LAB — COMPREHENSIVE METABOLIC PANEL
A/G RATIO: 1.6 (ref 1.2–2.2)
ALK PHOS: 93 IU/L (ref 39–117)
ALT: 30 IU/L (ref 0–44)
AST: 22 IU/L (ref 0–40)
Albumin: 4.6 g/dL (ref 3.5–5.5)
BUN/Creatinine Ratio: 12 (ref 9–20)
BUN: 13 mg/dL (ref 6–24)
Bilirubin Total: 0.8 mg/dL (ref 0.0–1.2)
CO2: 21 mmol/L (ref 18–29)
Calcium: 9.6 mg/dL (ref 8.7–10.2)
Chloride: 100 mmol/L (ref 96–106)
Creatinine, Ser: 1.12 mg/dL (ref 0.76–1.27)
GFR calc Af Amer: 90 mL/min/{1.73_m2} (ref >59)
GFR calc non Af Amer: 78 mL/min/{1.73_m2} (ref >59)
GLOBULIN, TOTAL: 2.9 g/dL (ref 1.5–4.5)
Glucose: 95 mg/dL (ref 65–99)
POTASSIUM: 4.4 mmol/L (ref 3.5–5.2)
SODIUM: 140 mmol/L (ref 134–144)
Total Protein: 7.5 g/dL (ref 6.0–8.5)

## 2016-07-18 LAB — VITAMIN D 25 HYDROXY (VIT D DEFICIENCY, FRACTURES): Vit D, 25-Hydroxy: 30.7 ng/mL (ref 30.0–100.0)

## 2016-07-18 NOTE — Progress Notes (Signed)
Advised  ED 

## 2016-08-10 ENCOUNTER — Other Ambulatory Visit: Payer: Self-pay | Admitting: Family Medicine

## 2016-09-17 LAB — HM COLONOSCOPY

## 2016-10-02 ENCOUNTER — Encounter: Payer: Self-pay | Admitting: Family Medicine

## 2016-10-07 ENCOUNTER — Encounter: Payer: Self-pay | Admitting: Family Medicine

## 2016-11-11 ENCOUNTER — Other Ambulatory Visit: Payer: Self-pay | Admitting: Family Medicine

## 2016-12-06 ENCOUNTER — Telehealth: Payer: Self-pay | Admitting: Family Medicine

## 2016-12-06 MED ORDER — SILDENAFIL CITRATE 100 MG PO TABS: 50.0000 mg | ORAL_TABLET | Freq: Every day | ORAL | 11 refills | Status: DC | PRN

## 2016-12-06 NOTE — Telephone Encounter (Signed)
Pt contacted office for refill request on the following medications:   Viagra. Pt is requesting the generic if possible.  [facility].  TR#[id number]

## 2017-01-09 DIAGNOSIS — D179 Benign lipomatous neoplasm, unspecified: Secondary | ICD-10-CM | POA: Diagnosis not present

## 2017-02-18 ENCOUNTER — Ambulatory Visit (INDEPENDENT_AMBULATORY_CARE_PROVIDER_SITE_OTHER): Payer: 59 | Admitting: Family Medicine

## 2017-02-18 ENCOUNTER — Encounter: Payer: Self-pay | Admitting: Family Medicine

## 2017-02-18 VITALS — BP 148/88 | HR 79 | Temp 98.1°F | Wt 293.2 lb

## 2017-02-18 DIAGNOSIS — B0059 Other herpesviral disease of eye: Secondary | ICD-10-CM | POA: Diagnosis not present

## 2017-02-18 MED ORDER — VALACYCLOVIR HCL 500 MG PO TABS: 500.0000 mg | ORAL_TABLET | Freq: Two times a day (BID) | ORAL | 0 refills | Status: DC

## 2017-02-18 NOTE — Progress Notes (Signed)
Patient: Travis Abbott Male    DOB: 05-08-1971   46 y.o.   MRN: [id number] Visit Date: 02/18/2017  Today's Provider: Vernie Murders, PA   Chief Complaint  Patient presents with  . Eye Pain   Subjective:    Eye Pain   The right (under lower lid) eye is affected. This is a new problem. Episode onset: Wednesday. The problem occurs constantly. The problem has been gradually improving. There was no injury mechanism. The patient is experiencing no pain (discomfort). Associated symptoms include an eye discharge and itching. Associated symptoms comments: Scaly area under right lower eye lid  . He has tried eye drops for the symptoms. The treatment provided no relief.   Past Medical History:  Diagnosis Date  . Abscess of scrotum 05/24/2013   Past Surgical History:  Procedure Laterality Date  . ABSCESS DRAINAGE  2013  . APPENDECTOMY  12/07/13   Dr. Bary Castilla, [facility]  . Biopsy of left parotid mass  2008   Benign  . CATARACT EXTRACTION     Left eye: 03/02/2001.  Right eye: 04/07/2000   Family History  Problem Relation Age of Onset  . Hypertension Mother   . Skin cancer Father   . Stomach cancer Maternal Grandmother   . Heart attack Maternal Grandfather   . Dementia Paternal Grandmother   . Kidney failure Paternal Grandfather    Allergies  Allergen Reactions  . Losartan Potassium     headaches     Previous Medications   CANDESARTAN (ATACAND) 16 MG TABLET    TAKE 1 TABLET BY MOUTH DAILY   CHOLECALCIFEROL (VITAMIN D3) 5000 UNITS TABS    Take by mouth.   FLUTICASONE (FLONASE) 50 MCG/ACT NASAL SPRAY    Place 2 sprays into both nostrils daily. Reported on 09/18/2015   INDOMETHACIN (INDOCIN) 50 MG CAPSULE    TAKE ONE CAPSULE BY MOUTH 3 TIMES A DAY AS NEEDED FOR PAIN   LORATADINE (CLARITIN) 10 MG TABLET    Take 10 mg by mouth daily.   PSYLLIUM (REGULOID) 0.52 G CAPSULE    Take 2 capsules by mouth daily.   SILDENAFIL (VIAGRA) 100 MG TABLET    Take 0.5-1 tablets (50-100 mg total) by mouth  daily as needed for erectile dysfunction.    Review of Systems  Constitutional: Negative.   Eyes: Positive for pain, discharge and itching.  Respiratory: Negative.   Cardiovascular: Negative.     Social History  Substance Use Topics  . Smoking status: Former Smoker    Packs/day: 1.00    Years: 10.00    Types: Cigarettes    Quit date: 05/21/2007  . Smokeless tobacco: Former Systems developer  . Alcohol use 0.0 oz/week     Comment: drinks 2 drinks per month   Objective:   BP (!) 148/88 (BP Location: Right Arm, Patient Position: Sitting, Cuff Size: Normal)   Pulse 79   Temp 98.1 F (36.7 C) (Oral)   Wt 293 lb 3.2 oz (133 kg)   SpO2 98%   BMI 40.89 kg/m   Physical Exam  Constitutional: He is oriented to person, place, and time. He appears well-developed and well-nourished. No distress.  HENT:  Head: Normocephalic and atraumatic.  Right Ear: Hearing normal.  Left Ear: Hearing normal.  Nose: Nose normal.  Mouth/Throat: Oropharynx is clear and moist.  Eyes: Conjunctivae and lids are normal. Right eye exhibits no discharge. Left eye exhibits no discharge. No scleral icterus.  Pinpoint scabs with 2-3 mm area of pinkness surrounding.  Neck:  Neck supple.  Pulmonary/Chest: Effort normal. No respiratory distress.  Musculoskeletal: Normal range of motion.  Lymphadenopathy:    He has no cervical adenopathy.  Neurological: He is alert and oriented to person, place, and time.  Skin: Skin is intact. No lesion and no rash noted.  Psychiatric: He has a normal mood and affect. His speech is normal and behavior is normal. Thought content normal.      Assessment & Plan:     1. Herpes simplex infection of right eyelid Onset 3-4 days ago with irritation to the right upper and lower eyelid. No vision changes. Suspect herpes simplex/"cold sore" virus to eyelid. - valACYclovir (VALTREX) 500 MG tablet; Take 1 tablet (500 mg total) by mouth 2 (two) times daily.  Dispense: 14 tablet; Refill: 0

## 2017-02-18 NOTE — Patient Instructions (Signed)
Cold Sore A cold sore, also called a fever blister, is a skin infection that is caused by a virus. This infection causes small, fluid-filled sores to form inside of the mouth or on the lips, gums, nose, chin, or cheeks. Cold sores can spread to other parts of the body, such as the eyes or fingers. Cold sores can be spread or passed from person to person (contagious) until the sores crust over completely. Cold sores can be spread through close contact, such as kissing or sharing a drinking glass. Follow these instructions at home: Medicines  Take or apply over-the-counter and prescription medicines only as told by your doctor.  Use a cotton-tip swab to apply creams or gels to your sores. Sore Care  Do not touch the sores or pick the scabs.  Wash your hands often. Do not touch your eyes without washing your hands first.  Keep the sores clean and dry.  If directed, apply ice to the sores:  Put ice in a plastic bag.  Place a towel between your skin and the bag.  Leave the ice on for 20 minutes, 2-3 times per day. Lifestyle  Do not kiss, have oral sex, or share personal items until your sores heal.  Eat a soft, bland diet. Avoid eating hot, cold, or salty foods. These can hurt your mouth.  Use a straw if it hurts to drink out of a glass.  Avoid the sun and limit your stress if these things trigger outbreaks. If sun causes cold sores, apply sunscreen on your lips before being out in the sun. Contact a doctor if:  You have symptoms for more than two weeks.  You have pus coming from the sores.  You have redness that is spreading.  You have pain or irritation in your eye.  You get sores on your genitals.  Your sores do not heal within two weeks.  You get cold sores often. Get help right away if:  You have a fever and your symptoms suddenly get worse.  You have a headache and confusion. This information is not intended to replace advice given to you by your health care  provider. Make sure you discuss any questions you have with your health care provider. Document Released: 11/05/2011 Document Revised: 10/12/2015 Document Reviewed: 02/24/2015 Elsevier Interactive Patient Education  2018 Elsevier Inc.  

## 2017-05-05 DIAGNOSIS — R208 Other disturbances of skin sensation: Secondary | ICD-10-CM | POA: Diagnosis not present

## 2017-05-05 DIAGNOSIS — L02213 Cutaneous abscess of chest wall: Secondary | ICD-10-CM | POA: Diagnosis not present

## 2017-05-05 DIAGNOSIS — L0390 Cellulitis, unspecified: Secondary | ICD-10-CM | POA: Diagnosis not present

## 2017-05-05 DIAGNOSIS — R234 Changes in skin texture: Secondary | ICD-10-CM | POA: Diagnosis not present

## 2017-06-16 ENCOUNTER — Ambulatory Visit (INDEPENDENT_AMBULATORY_CARE_PROVIDER_SITE_OTHER): Payer: 59 | Admitting: Family Medicine

## 2017-06-16 ENCOUNTER — Encounter: Payer: Self-pay | Admitting: Family Medicine

## 2017-06-16 VITALS — BP 120/84 | HR 92 | Temp 98.4°F | Resp 16 | Wt 293.0 lb

## 2017-06-16 DIAGNOSIS — J329 Chronic sinusitis, unspecified: Secondary | ICD-10-CM | POA: Diagnosis not present

## 2017-06-16 DIAGNOSIS — M940 Chondrocostal junction syndrome [Tietze]: Secondary | ICD-10-CM | POA: Diagnosis not present

## 2017-06-16 DIAGNOSIS — R002 Palpitations: Secondary | ICD-10-CM

## 2017-06-16 MED ORDER — AMOXICILLIN 500 MG PO CAPS: 1000.0000 mg | ORAL_CAPSULE | Freq: Two times a day (BID) | ORAL | 0 refills | Status: AC

## 2017-06-16 NOTE — Progress Notes (Signed)
Patient: Travis Abbott Male    DOB: 01/19/1971   47 y.o.   MRN: [id number] Visit Date: 06/16/2017  Today's Provider: Lelon Huh, MD   Chief Complaint  Patient presents with  . Cough   Subjective:    Patient has had cough and sinus pressure for 5 days. Cough is productive. Other symptoms include sinus pressure, sinus pain, chest congestion, increased heart rate. Patient has been taking coricidin otc with no relief.   Cough  This is a new problem. The current episode started in the past 7 days (2 days). The problem has been gradually worsening. The cough is productive of sputum. Associated symptoms include chills, nasal congestion and postnasal drip. Pertinent negatives include no chest pain, ear congestion, ear pain, fever, headaches, heartburn, hemoptysis, myalgias, rash, rhinorrhea, sore throat, shortness of breath, sweats, weight loss or wheezing. The symptoms are aggravated by lying down. Treatments tried: coricidin. The treatment provided no relief. His past medical history is significant for bronchitis.   Also complains he started having chest discomfort on January 8th associated with coughing and taking deep breaths which has greatly improved  States having episodes of heart racing on 06/10/2017, no assocciated chest pains or dyspnea. States that he feels like his heart is racing now.   He also reports pain in left upper chest for last week, which is mostly resolve now except if he coughs. No dyspnea. No known injury.    Allergies  Allergen Reactions  . Losartan Potassium     headaches     Current Outpatient Medications:  .  candesartan (ATACAND) 16 MG tablet, TAKE 1 TABLET BY MOUTH DAILY, Disp: 90 tablet, Rfl: 3 .  Cholecalciferol (VITAMIN D3) 5000 units TABS, Take by mouth., Disp: , Rfl:  .  fluticasone (FLONASE) 50 MCG/ACT nasal spray, Place 2 sprays into both nostrils daily. Reported on 09/18/2015, Disp: 16 g, Rfl: 1 .  indomethacin (INDOCIN) 50 MG capsule,  TAKE ONE CAPSULE BY MOUTH 3 TIMES A DAY AS NEEDED FOR PAIN, Disp: 30 capsule, Rfl: 5 .  loratadine (CLARITIN) 10 MG tablet, Take 10 mg by mouth daily., Disp: , Rfl:  .  psyllium (REGULOID) 0.52 G capsule, Take 2 capsules by mouth daily., Disp: , Rfl:  .  sildenafil (VIAGRA) 100 MG tablet, Take 0.5-1 tablets (50-100 mg total) by mouth daily as needed for erectile dysfunction., Disp: 5 tablet, Rfl: 11 .  valACYclovir (VALTREX) 500 MG tablet, Take 1 tablet (500 mg total) by mouth 2 (two) times daily., Disp: 14 tablet, Rfl: 0  Review of Systems  Constitutional: Positive for chills. Negative for appetite change, fever and weight loss.  HENT: Positive for congestion, postnasal drip, sinus pressure and sinus pain. Negative for ear pain, rhinorrhea and sore throat.   Respiratory: Positive for cough. Negative for hemoptysis, chest tightness, shortness of breath and wheezing.   Cardiovascular: Negative for chest pain and palpitations.  Gastrointestinal: Negative for abdominal pain, heartburn, nausea and vomiting.  Musculoskeletal: Negative for myalgias.  Skin: Negative for rash.  Neurological: Negative for headaches.    Social History   Tobacco Use  . Smoking status: Former Smoker    Packs/day: 1.00    Years: 10.00    Pack years: 10.00    Types: Cigarettes    Last attempt to quit: 05/21/2007    Years since quitting: 10.0  . Smokeless tobacco: Former Network engineer Use Topics  . Alcohol use: Yes    Alcohol/week: 0.0 oz  Comment: drinks 2 drinks per month   Objective:   BP 120/84 (BP Location: Right Arm, Patient Position: Sitting, Cuff Size: Large)   Pulse 92   Temp 98.4 F (36.9 C) (Oral)   Resp 16   Wt 293 lb (132.9 kg)   SpO2 98%   BMI 40.87 kg/m  Vitals:   06/16/17 1008  BP: 120/84  Pulse: 92  Resp: 16  Temp: 98.4 F (36.9 C)  TempSrc: Oral  SpO2: 98%  Weight: 293 lb (132.9 kg)     Physical Exam  General Appearance:    Alert, cooperative, no distress  HENT:    bilateral TM normal without fluid or infection,neck without nodes, throat normal without erythema or exudate, fronatl sinuses tender and nasal mucosa pale and congested. Post nasal drainage noted.   Eyes:    PERRL, conjunctiva/corneas clear, EOM's intact       Lungs:     Clear to auscultation bilaterally, respirations unlabored  Heart:    Regular rate and rhythm  Neurologic:   Awake, alert, oriented x 3. No apparent focal neurological           defect.   MS:   Chest reproduced with palpation of left anterior upper chest. No gross deformities.    EKG: NSR, no acute changes.     Assessment & Plan:     1. Sinusitis, unspecified chronicity, unspecified location  - amoxicillin (AMOXIL) 500 MG capsule; Take 2 capsules (1,000 mg total) by mouth 2 (two) times daily for 10 days.  Dispense: 40 capsule; Refill: 0  2. Palpitation Normal EKG and cardiac auscultation. Counseled on avoidance of stimulants.  - EKG 12-Lead  3. Costochondritis Improving. Expect complete resolution in 2-3 weeks. Call otherwise.        Lelon Huh, MD  [facility]

## 2017-06-16 NOTE — Patient Instructions (Signed)
Palpitations A palpitation is the feeling that your heart:  Has an uneven (irregular) heartbeat.  Is beating faster than normal.  Is fluttering.  Is skipping a beat.  This is usually not a serious problem. In some cases, you may need more medical tests. Follow these instructions at home:  Avoid: ? Caffeine in coffee, tea, soft drinks, diet pills, and energy drinks. ? Chocolate. ? Alcohol.  Do not use any tobacco products. These include cigarettes, chewing tobacco, and e-cigarettes. If you need help quitting, ask your doctor.  Try to reduce your stress. These things may help: ? Yoga. ? Meditation. ? Physical activity. Swimming, jogging, and walking are good choices. ? A method that helps you use your mind to control things in your body, like heartbeats (biofeedback).  Get plenty of rest and sleep.  Take over-the-counter and prescription medicines only as told by your doctor.  Keep all follow-up visits as told by your doctor. This is important. Contact a doctor if:  Your heartbeat is still fast or uneven after 24 hours.  Your palpitations occur more often. Get help right away if:  You have chest pain.  You feel short of breath.  You have a very bad headache.  You feel dizzy.  You pass out (faint). This information is not intended to replace advice given to you by your health care provider. Make sure you discuss any questions you have with your health care provider. Document Released: 02/13/2008 Document Revised: 10/12/2015 Document Reviewed: 01/19/2015 Elsevier Interactive Patient Education  2018 Elsevier Inc.  

## 2017-06-23 DIAGNOSIS — L720 Epidermal cyst: Secondary | ICD-10-CM | POA: Diagnosis not present

## 2017-06-23 DIAGNOSIS — Z872 Personal history of diseases of the skin and subcutaneous tissue: Secondary | ICD-10-CM | POA: Diagnosis not present

## 2017-09-16 DIAGNOSIS — D485 Neoplasm of uncertain behavior of skin: Secondary | ICD-10-CM | POA: Diagnosis not present

## 2017-09-16 DIAGNOSIS — L720 Epidermal cyst: Secondary | ICD-10-CM | POA: Diagnosis not present

## 2017-11-22 ENCOUNTER — Other Ambulatory Visit: Payer: Self-pay | Admitting: Family Medicine

## 2017-12-11 NOTE — Progress Notes (Signed)
Patient: Travis Abbott Male    DOB: [birth year]   47 y.o.   MRN: [id number] Visit Date: 12/12/2017  Today's Provider: Lelon Huh, MD   Chief Complaint  Patient presents with  . Hypertension   Subjective:    HPI  Hypertension, follow-up:  BP Readings from Last 3 Encounters:  12/12/17 (!) 163/95  06/16/17 120/84  02/18/17 (!) 148/88   He was last seen for hypertension more than 1 years ago.  BP at last visit was 120/84. Management changes since that visit include no changes. He reports fair compliance with treatment. Patient states he ran out of medication on Sunday. He has not taken BP meds all week. He is not having side effects.  He is not exercising. He is adherent to low salt diet.   Outside blood pressures are not being checked. He is experiencing none.  Patient denies chest pain, chest pressure/discomfort, claudication, dyspnea, exertional chest pressure/discomfort, fatigue, irregular heart beat, lower extremity edema, near-syncope, orthopnea, palpitations, paroxysmal nocturnal dyspnea, syncope and tachypnea.   Cardiovascular risk factors include hypertension, male gender and obesity (BMI >= 30 kg/m2).  Use of agents associated with hypertension: none.     Weight trend: stable Wt Readings from Last 3 Encounters:  12/12/17 291 lb (132 kg)  06/16/17 293 lb (132.9 kg)  02/18/17 293 lb 3.2 oz (133 kg)    ------------------------------------------------------------------------  Allergies  Allergen Reactions  . Losartan Potassium     headaches     Current Outpatient Medications:  .  candesartan (ATACAND) 16 MG tablet, TAKE 1 TABLET BY MOUTH EVERY DAY, Disp: 90 tablet, Rfl: 0 .  Cholecalciferol (VITAMIN D3) 5000 units TABS, Take by mouth., Disp: , Rfl:  .  fluticasone (FLONASE) 50 MCG/ACT nasal spray, Place 2 sprays into both nostrils daily. Reported on 09/18/2015, Disp: 16 g, Rfl: 1 .  indomethacin (INDOCIN) 50 MG capsule, TAKE ONE CAPSULE BY MOUTH 3  TIMES A DAY AS NEEDED FOR PAIN, Disp: 30 capsule, Rfl: 5 .  loratadine (CLARITIN) 10 MG tablet, Take 10 mg by mouth daily., Disp: , Rfl:  .  psyllium (REGULOID) 0.52 G capsule, Take 2 capsules by mouth daily., Disp: , Rfl:  .  sildenafil (VIAGRA) 100 MG tablet, Take 0.5-1 tablets (50-100 mg total) by mouth daily as needed for erectile dysfunction., Disp: 5 tablet, Rfl: 11 .  valACYclovir (VALTREX) 500 MG tablet, Take 1 tablet (500 mg total) by mouth 2 (two) times daily., Disp: 14 tablet, Rfl: 0  Review of Systems  Constitutional: Negative for appetite change, chills and fever.  Respiratory: Negative for chest tightness, shortness of breath and wheezing.   Cardiovascular: Negative for chest pain and palpitations.  Gastrointestinal: Negative for abdominal pain, nausea and vomiting.    Social History   Tobacco Use  . Smoking status: Former Smoker    Packs/day: 1.00    Years: 10.00    Pack years: 10.00    Types: Cigarettes    Last attempt to quit: 05/21/2007    Years since quitting: 10.5  . Smokeless tobacco: Former Network engineer Use Topics  . Alcohol use: Yes    Alcohol/week: 0.0 oz    Comment: drinks 2 drinks per month   Objective:   BP (!) 163/95 (BP Location: Left Arm, Patient Position: Sitting, Cuff Size: Large)   Pulse 89   Temp 98.2 F (36.8 C) (Oral)   Wt 291 lb (132 kg)   SpO2 98%   BMI 40.59 kg/m  Vitals:   12/12/17 1618  BP: (!) 163/95  Pulse: 89  Temp: 98.2 F (36.8 C)  TempSrc: Oral  SpO2: 98%  Weight: 291 lb (132 kg)     Physical Exam   General Appearance:    Alert, cooperative, no distress  Eyes:    PERRL, conjunctiva/corneas clear, EOM's intact       Lungs:     Clear to auscultation bilaterally, respirations unlabored  Heart:    Regular rate and rhythm  Derm:   About 2 x 41mm area of erythema around tick bite that was found about 5 days ago on right lateral upper back.           Assessment & Plan:     1. Essential (primary)  hypertension Currently off of medication. He is to stop by office after being back on meds to check BP - Comprehensive metabolic panel - Lipid panel  2. Vitamin D deficiency Currently off of supplements .  - VITAMIN D 25 Hydroxy (Vit-D Deficiency, Fractures)  3. Tick bite, initial encounter Counseled on s/s of tick fever and skin infections and to call if any develop.       Lelon Huh, MD  [facility]

## 2017-12-12 ENCOUNTER — Ambulatory Visit (INDEPENDENT_AMBULATORY_CARE_PROVIDER_SITE_OTHER): Payer: 59 | Admitting: Family Medicine

## 2017-12-12 ENCOUNTER — Encounter: Payer: Self-pay | Admitting: Family Medicine

## 2017-12-12 VITALS — BP 163/95 | HR 89 | Temp 98.2°F | Wt 291.0 lb

## 2017-12-12 DIAGNOSIS — W57XXXA Bitten or stung by nonvenomous insect and other nonvenomous arthropods, initial encounter: Secondary | ICD-10-CM | POA: Diagnosis not present

## 2017-12-12 DIAGNOSIS — E559 Vitamin D deficiency, unspecified: Secondary | ICD-10-CM | POA: Diagnosis not present

## 2017-12-12 DIAGNOSIS — I10 Essential (primary) hypertension: Secondary | ICD-10-CM

## 2017-12-12 MED ORDER — CANDESARTAN CILEXETIL 16 MG PO TABS: 16.0000 mg | ORAL_TABLET | Freq: Every day | ORAL | 1 refills | Status: DC

## 2017-12-12 NOTE — Patient Instructions (Signed)
   Stop by office after getting your blood drawn to check your blood pressure

## 2017-12-16 ENCOUNTER — Encounter: Payer: Self-pay | Admitting: *Deleted

## 2017-12-16 DIAGNOSIS — E559 Vitamin D deficiency, unspecified: Secondary | ICD-10-CM | POA: Diagnosis not present

## 2017-12-16 DIAGNOSIS — I10 Essential (primary) hypertension: Secondary | ICD-10-CM | POA: Diagnosis not present

## 2017-12-16 NOTE — Progress Notes (Signed)
Patient stopped by office for Bp check. Patient has been back on blood pressure medication since Friday 27, 2019.

## 2017-12-17 LAB — COMPREHENSIVE METABOLIC PANEL
A/G RATIO: 1.4 (ref 1.2–2.2)
ALBUMIN: 4.4 g/dL (ref 3.5–5.5)
ALK PHOS: 95 IU/L (ref 39–117)
ALT: 43 IU/L (ref 0–44)
AST: 30 IU/L (ref 0–40)
BILIRUBIN TOTAL: 0.5 mg/dL (ref 0.0–1.2)
BUN/Creatinine Ratio: 11 (ref 9–20)
BUN: 12 mg/dL (ref 6–24)
CHLORIDE: 106 mmol/L (ref 96–106)
CO2: 21 mmol/L (ref 20–29)
Calcium: 9.6 mg/dL (ref 8.7–10.2)
Creatinine, Ser: 1.09 mg/dL (ref 0.76–1.27)
GFR calc Af Amer: 92 mL/min/{1.73_m2} (ref >59)
GFR calc non Af Amer: 80 mL/min/{1.73_m2} (ref >59)
GLOBULIN, TOTAL: 3.1 g/dL (ref 1.5–4.5)
GLUCOSE: 85 mg/dL (ref 65–99)
POTASSIUM: 4.1 mmol/L (ref 3.5–5.2)
SODIUM: 141 mmol/L (ref 134–144)
Total Protein: 7.5 g/dL (ref 6.0–8.5)

## 2017-12-17 LAB — VITAMIN D 25 HYDROXY (VIT D DEFICIENCY, FRACTURES): VIT D 25 HYDROXY: 24.1 ng/mL — AB (ref 30.0–100.0)

## 2017-12-17 LAB — LIPID PANEL
CHOLESTEROL TOTAL: 150 mg/dL (ref 100–199)
Chol/HDL Ratio: 5.2 ratio — ABNORMAL HIGH (ref 0.0–5.0)
HDL: 29 mg/dL — ABNORMAL LOW (ref >39)
LDL Calculated: 59 mg/dL (ref 0–99)
Triglycerides: 310 mg/dL — ABNORMAL HIGH (ref 0–149)
VLDL Cholesterol Cal: 62 mg/dL — ABNORMAL HIGH (ref 5–40)

## 2017-12-17 NOTE — Progress Notes (Signed)
BP Readings from Last 2 Encounters:  12/16/17 (!) 146/87  12/12/17 (!) 163/95   See result note. Advised to continue candesartan and return for BP check and labs in 4 months.

## 2018-04-11 ENCOUNTER — Other Ambulatory Visit: Payer: Self-pay | Admitting: Family Medicine

## 2018-04-11 MED ORDER — CANDESARTAN CILEXETIL 16 MG PO TABS: 16.0000 mg | ORAL_TABLET | Freq: Every day | ORAL | 4 refills | Status: DC

## 2018-04-11 NOTE — Addendum Note (Signed)
Addended by: Lelon Huh E on: 04/11/2018 11:10 AM   Modules accepted: Orders

## 2018-04-21 ENCOUNTER — Ambulatory Visit (INDEPENDENT_AMBULATORY_CARE_PROVIDER_SITE_OTHER): Payer: 59 | Admitting: Family Medicine

## 2018-04-21 ENCOUNTER — Encounter: Payer: Self-pay | Admitting: Family Medicine

## 2018-04-21 VITALS — BP 140/90 | HR 76 | Temp 98.4°F | Resp 16 | Wt 293.0 lb

## 2018-04-21 DIAGNOSIS — I10 Essential (primary) hypertension: Secondary | ICD-10-CM

## 2018-04-21 DIAGNOSIS — E559 Vitamin D deficiency, unspecified: Secondary | ICD-10-CM

## 2018-04-21 DIAGNOSIS — E781 Pure hyperglyceridemia: Secondary | ICD-10-CM | POA: Diagnosis not present

## 2018-04-21 NOTE — Progress Notes (Signed)
Patient: Travis Abbott Male    DOB: [birth year]   47 y.o.   MRN: [id number] Visit Date: 04/21/2018  Today's Provider: Lelon Huh, MD   Chief Complaint  Patient presents with  . Hypertension   Subjective:    HPI  Hypertension, follow-up:  BP Readings from Last 3 Encounters:  04/21/18 140/90  12/16/17 (!) 146/87  12/12/17 (!) 163/95    He was last seen for hypertension 4 months ago.  BP at that visit was 163/95. Management since that visit includes restarting Candesartan. He reports fair compliance with treatment (patient states he has missed a few days within the past week) He is not having side effects.  He is not exercising. He is adherent to low salt diet.   Outside blood pressures are not being checked. He is experiencing none.  Patient denies chest pain, chest pressure/discomfort, claudication, dyspnea, exertional chest pressure/discomfort, fatigue, irregular heart beat, lower extremity edema, near-syncope, orthopnea, palpitations, paroxysmal nocturnal dyspnea, syncope and tachypnea.   Cardiovascular risk factors include hypertension and male gender.  Use of agents associated with hypertension: none.     Weight trend: fluctuating a bit Wt Readings from Last 3 Encounters:  04/21/18 293 lb (132.9 kg)  12/12/17 291 lb (132 kg)  06/16/17 293 lb (132.9 kg)    Current diet: in general, an "unhealthy" diet  ------------------------------------------------------------------------ Vitamin D Deficiency: Patient was last seen for this problem 4 months ago and was restarted on Vitamin D3 5,000 units daily. Patient reports fair compliance with treatment. He has been taking Vitamin D3 2,000units two pills daily.   Cholesterol Follow up: Patients last labs were done 12/12/2017 showing high Triglycerides. Patient was advised to avoid sugar and white starchy foods as much as possible. Today patient comes in reporting that he has tried cutting back on sweets and  starchy foods.  Lab Results  Component Value Date   CHOL 150 12/16/2017   HDL 29 (L) 12/16/2017   LDLCALC 59 12/16/2017   TRIG 310 (H) 12/16/2017   CHOLHDL 5.2 (H) 12/16/2017       Allergies  Allergen Reactions  . Losartan Potassium     headaches     Current Outpatient Medications:  .  candesartan (ATACAND) 16 MG tablet, Take 1 tablet (16 mg total) by mouth daily., Disp: 90 tablet, Rfl: 4 .  Cholecalciferol (VITAMIN D3) 5000 units TABS, Take 4,000 Units by mouth daily. , Disp: , Rfl:  .  fluticasone (FLONASE) 50 MCG/ACT nasal spray, Place 2 sprays into both nostrils daily. Reported on 09/18/2015, Disp: 16 g, Rfl: 1 .  indomethacin (INDOCIN) 50 MG capsule, TAKE ONE CAPSULE BY MOUTH 3 TIMES A DAY AS NEEDED FOR PAIN, Disp: 30 capsule, Rfl: 5 .  loratadine (CLARITIN) 10 MG tablet, Take 10 mg by mouth daily., Disp: , Rfl:  .  psyllium (REGULOID) 0.52 G capsule, Take 2 capsules by mouth daily., Disp: , Rfl:  .  sildenafil (VIAGRA) 100 MG tablet, Take 0.5-1 tablets (50-100 mg total) by mouth daily as needed for erectile dysfunction., Disp: 5 tablet, Rfl: 11 .  valACYclovir (VALTREX) 500 MG tablet, Take 1 tablet (500 mg total) by mouth 2 (two) times daily., Disp: 14 tablet, Rfl: 0  Review of Systems  Constitutional: Negative for appetite change, chills and fever.  Respiratory: Negative for chest tightness, shortness of breath and wheezing.   Cardiovascular: Negative for chest pain and palpitations.  Gastrointestinal: Negative for abdominal pain, nausea and vomiting.    Social  History   Tobacco Use  . Smoking status: Former Smoker    Packs/day: 1.00    Years: 10.00    Pack years: 10.00    Types: Cigarettes    Last attempt to quit: 05/21/2007    Years since quitting: 10.9  . Smokeless tobacco: Former Network engineer Use Topics  . Alcohol use: Yes    Alcohol/week: 0.0 standard drinks    Comment: drinks 2 drinks per month   Objective:   BP 140/90 (BP Location: Right Arm, Cuff  Size: Large)   Pulse 76   Temp 98.4 F (36.9 C) (Oral)   Resp 16   Wt 293 lb (132.9 kg)   SpO2 97% Comment: room air  BMI 40.87 kg/m  Vitals:   04/21/18 1633 04/21/18 1635  BP: (!) 144/88 140/90  Pulse: 76   Resp: 16   Temp: 98.4 F (36.9 C)   TempSrc: Oral   SpO2: 97%   Weight: 293 lb (132.9 kg)      Physical Exam   General Appearance:    Alert, cooperative, no distress, obese  Eyes:    PERRL, conjunctiva/corneas clear, EOM's intact       Lungs:     Clear to auscultation bilaterally, respirations unlabored  Heart:    Regular rate and rhythm  Neurologic:   Awake, alert, oriented x 3. No apparent focal neurological           defect.         Assessment & Plan:     1. Essential (primary) hypertension Improved on candesartan. Continue current medications.   - Renal function panel  2. Vitamin D deficiency  - VITAMIN D 25 Hydroxy (Vit-D Deficiency, Fractures)  3. Hypertriglyceridemia Working on improving diet.  - Lipid panel       Lelon Huh, MD  [facility]

## 2018-04-22 DIAGNOSIS — E559 Vitamin D deficiency, unspecified: Secondary | ICD-10-CM | POA: Diagnosis not present

## 2018-04-22 DIAGNOSIS — I10 Essential (primary) hypertension: Secondary | ICD-10-CM | POA: Diagnosis not present

## 2018-04-22 DIAGNOSIS — E781 Pure hyperglyceridemia: Secondary | ICD-10-CM | POA: Diagnosis not present

## 2018-04-23 ENCOUNTER — Other Ambulatory Visit: Payer: Self-pay

## 2018-04-23 LAB — LIPID PANEL
CHOLESTEROL TOTAL: 165 mg/dL (ref 100–199)
Chol/HDL Ratio: 5.2 ratio — ABNORMAL HIGH (ref 0.0–5.0)
HDL: 32 mg/dL — ABNORMAL LOW (ref >39)
LDL CALC: 90 mg/dL (ref 0–99)
Triglycerides: 214 mg/dL — ABNORMAL HIGH (ref 0–149)
VLDL Cholesterol Cal: 43 mg/dL — ABNORMAL HIGH (ref 5–40)

## 2018-04-23 LAB — RENAL FUNCTION PANEL
Albumin: 4.4 g/dL (ref 3.5–5.5)
BUN / CREAT RATIO: 13 (ref 9–20)
BUN: 15 mg/dL (ref 6–24)
CALCIUM: 9.7 mg/dL (ref 8.7–10.2)
CO2: 22 mmol/L (ref 20–29)
CREATININE: 1.19 mg/dL (ref 0.76–1.27)
Chloride: 98 mmol/L (ref 96–106)
GFR calc Af Amer: 82 mL/min/{1.73_m2} (ref >59)
GFR calc non Af Amer: 71 mL/min/{1.73_m2} (ref >59)
Glucose: 97 mg/dL (ref 65–99)
Phosphorus: 3.1 mg/dL (ref 2.5–4.5)
Potassium: 4.2 mmol/L (ref 3.5–5.2)
Sodium: 140 mmol/L (ref 134–144)

## 2018-04-23 LAB — VITAMIN D 25 HYDROXY (VIT D DEFICIENCY, FRACTURES): VIT D 25 HYDROXY: 43.6 ng/mL (ref 30.0–100.0)

## 2018-04-23 NOTE — Telephone Encounter (Signed)
-----   Message from Birdie Sons, MD sent at 04/23/2018 12:31 PM EST ----- Vitamin d levels is better. Continue current supplement. Triglycerides a little high but improved since July. Avoid sweets and starchy foods. Cholesterol is good.  Continue current medications.  Follow up BP in 6 months.

## 2018-04-23 NOTE — Telephone Encounter (Signed)
Tried calling; no answer. 04/23/2018  Thanks,   -Mickel Baas

## 2018-04-27 MED ORDER — SILDENAFIL CITRATE 100 MG PO TABS: 50.0000 mg | ORAL_TABLET | Freq: Every day | ORAL | 11 refills | Status: DC | PRN

## 2018-04-27 NOTE — Telephone Encounter (Signed)
Pt advised.  He also needs a refill on Sildenafil sent to [facility] [address].   Thanks,   -Mickel Baas

## 2018-04-27 NOTE — Telephone Encounter (Signed)
LMTCB 04/27/2018  Thanks,   -Boston Cookson  

## 2018-04-27 NOTE — Telephone Encounter (Signed)
Patient returned your call. KW 

## 2018-04-29 ENCOUNTER — Telehealth: Payer: Self-pay | Admitting: Family Medicine

## 2018-04-29 NOTE — Telephone Encounter (Signed)
We received a fax from [facility] stating that sildenafil (VIAGRA) 100 MG tablet is not covered under insurance and they would like for you to send in 20mg  instead because they have a good coupon for the 20mg .

## 2018-04-30 MED ORDER — SILDENAFIL CITRATE 20 MG PO TABS: ORAL_TABLET | ORAL | 3 refills | Status: DC

## 2018-07-20 ENCOUNTER — Ambulatory Visit: Payer: 59 | Admitting: Family Medicine

## 2018-07-20 NOTE — Progress Notes (Deleted)
       Patient: Travis Abbott Male    DOB: 01-Dec-1970   48 y.o.   MRN: [id number] Visit Date: 07/20/2018  Today's Provider: Vernie Murders, PA   No chief complaint on file.  Subjective:     Knee Pain      Allergies  Allergen Reactions  . Losartan Potassium     headaches     Current Outpatient Medications:  .  candesartan (ATACAND) 16 MG tablet, Take 1 tablet (16 mg total) by mouth daily., Disp: 90 tablet, Rfl: 4 .  Cholecalciferol (VITAMIN D3) 5000 units TABS, Take 4,000 Units by mouth daily. , Disp: , Rfl:  .  fluticasone (FLONASE) 50 MCG/ACT nasal spray, Place 2 sprays into both nostrils daily. Reported on 09/18/2015, Disp: 16 g, Rfl: 1 .  indomethacin (INDOCIN) 50 MG capsule, TAKE ONE CAPSULE BY MOUTH 3 TIMES A DAY AS NEEDED FOR PAIN, Disp: 30 capsule, Rfl: 5 .  loratadine (CLARITIN) 10 MG tablet, Take 10 mg by mouth daily., Disp: , Rfl:  .  psyllium (REGULOID) 0.52 G capsule, Take 2 capsules by mouth daily., Disp: , Rfl:  .  sildenafil (REVATIO) 20 MG tablet, 3-5 tablets as needed prior to intercourse, not to exceed 5 tablets in a day, Disp: 30 tablet, Rfl: 3 .  sildenafil (VIAGRA) 100 MG tablet, Take 0.5-1 tablets (50-100 mg total) by mouth daily as needed for erectile dysfunction., Disp: 5 tablet, Rfl: 11 .  valACYclovir (VALTREX) 500 MG tablet, Take 1 tablet (500 mg total) by mouth 2 (two) times daily., Disp: 14 tablet, Rfl: 0  Review of Systems  Constitutional: Negative for appetite change, chills and fever.  Respiratory: Negative for chest tightness, shortness of breath and wheezing.   Cardiovascular: Negative for chest pain and palpitations.  Gastrointestinal: Negative for abdominal pain, nausea and vomiting.    Social History   Tobacco Use  . Smoking status: Former Smoker    Packs/day: 1.00    Years: 10.00    Pack years: 10.00    Types: Cigarettes    Last attempt to quit: 05/21/2007    Years since quitting: 11.1  . Smokeless tobacco: Former Network engineer  Use Topics  . Alcohol use: Yes    Alcohol/week: 0.0 standard drinks    Comment: drinks 2 drinks per month      Objective:   There were no vitals taken for this visit. There were no vitals filed for this visit.   Physical Exam      Assessment & Plan        Vernie Murders, PA  [facility]

## 2018-07-31 ENCOUNTER — Other Ambulatory Visit: Payer: Self-pay | Admitting: Family Medicine

## 2018-10-28 ENCOUNTER — Encounter: Payer: Self-pay | Admitting: Family Medicine

## 2018-10-28 ENCOUNTER — Other Ambulatory Visit: Payer: Self-pay

## 2018-10-28 ENCOUNTER — Ambulatory Visit (INDEPENDENT_AMBULATORY_CARE_PROVIDER_SITE_OTHER): Payer: 59 | Admitting: Family Medicine

## 2018-10-28 VITALS — BP 128/76 | HR 84 | Temp 98.4°F | Resp 16 | Wt 286.0 lb

## 2018-10-28 DIAGNOSIS — I10 Essential (primary) hypertension: Secondary | ICD-10-CM

## 2018-10-28 DIAGNOSIS — E559 Vitamin D deficiency, unspecified: Secondary | ICD-10-CM

## 2018-10-28 DIAGNOSIS — E781 Pure hyperglyceridemia: Secondary | ICD-10-CM | POA: Diagnosis not present

## 2018-10-28 NOTE — Progress Notes (Signed)
Patient: Travis Abbott Male    DOB: 10-Sep-1970   48 y.o.   MRN: [id number] Visit Date: 10/28/2018  Today's Provider: Lelon Huh, MD   Chief Complaint  Patient presents with  . Hyperlipidemia  . Hypertension   Subjective:     HPI      Hypertension, follow-up:  BP Readings from Last 3 Encounters:  10/28/18 128/76  04/21/18 140/90  12/16/17 (!) 146/87    He was last seen for hypertension 6 months ago.  BP at that visit was 144/88. Management since that visit includes No changes He reports excellent compliance with treatment. He is not having side effects.  He is exercising. Walks some. He is adherent to low salt diet.   Outside blood pressures are not being checked. He is experiencing none.  Patient denies chest pain, dyspnea, fatigue, near-syncope and palpitations.   Cardiovascular risk factors include hypertension, male gender and obesity (BMI >= 30 kg/m2).  Use of agents associated with hypertension: none.   ------------------------------------------------------------------------    Lipid/Cholesterol, Follow-up:   Last seen for this 6 months ago.  Management since that visit includes working on a healthy lifestyle. He has cut out sweets, but still has trouble controlling how much bread he consumes.  Last Lipid Panel:    Component Value Date/Time   CHOL 165 04/22/2018 0805   TRIG 214 (H) 04/22/2018 0805   HDL 32 (L) 04/22/2018 0805   CHOLHDL 5.2 (H) 04/22/2018 0805   LDLCALC 90 04/22/2018 0805    He reports excellent compliance with treatment. He is not having side effects.   Wt Readings from Last 3 Encounters:  10/28/18 286 lb (129.7 kg)  04/21/18 293 lb (132.9 kg)  12/12/17 291 lb (132 kg)    ------------------------------------------------------------------------    Allergies  Allergen Reactions  . Losartan Potassium     headaches     Current Outpatient Medications:  .  candesartan (ATACAND) 16 MG tablet, Take 1 tablet (16  mg total) by mouth daily., Disp: 90 tablet, Rfl: 4 .  Cholecalciferol (VITAMIN D3) 5000 units TABS, Take 4,000 Units by mouth daily. , Disp: , Rfl:  .  EQ ASPIRIN LOW DOSE PO, Take 1 tablet by mouth., Disp: , Rfl:  .  fluticasone (FLONASE) 50 MCG/ACT nasal spray, Place 2 sprays into both nostrils daily. Reported on 09/18/2015, Disp: 16 g, Rfl: 1 .  indomethacin (INDOCIN) 50 MG capsule, TAKE ONE CAPSULE BY MOUTH 3 TIMES A DAY AS NEEDED FOR PAIN, Disp: 30 capsule, Rfl: 5 .  loratadine (CLARITIN) 10 MG tablet, Take 10 mg by mouth daily., Disp: , Rfl:  .  psyllium (REGULOID) 0.52 G capsule, Take 2 capsules by mouth daily., Disp: , Rfl:  .  sildenafil (REVATIO) 20 MG tablet, 3-5 tablets as needed prior to intercourse, not to exceed 5 tablets in a day, Disp: 30 tablet, Rfl: 3 .  valACYclovir (VALTREX) 500 MG tablet, Take 1 tablet (500 mg total) by mouth 2 (two) times daily., Disp: 14 tablet, Rfl: 0 .  sildenafil (VIAGRA) 100 MG tablet, Take 0.5-1 tablets (50-100 mg total) by mouth daily as needed for erectile dysfunction., Disp: 5 tablet, Rfl: 11  Review of Systems  Constitutional: Negative.   Respiratory: Negative.   Cardiovascular: Negative.   Gastrointestinal: Negative.   Musculoskeletal: Negative.   Neurological: Negative for dizziness, light-headedness and headaches.    Social History   Tobacco Use  . Smoking status: Former Smoker    Packs/day: 1.00  Years: 10.00    Pack years: 10.00    Types: Cigarettes    Last attempt to quit: 05/21/2007    Years since quitting: 11.4  . Smokeless tobacco: Former Network engineer Use Topics  . Alcohol use: Yes    Alcohol/week: 0.0 standard drinks    Comment: drinks 2 drinks per month      Objective:   BP 128/76 (BP Location: Right Arm, Patient Position: Sitting, Cuff Size: Large)   Pulse 84   Temp 98.4 F (36.9 C) (Oral)   Resp 16   Wt 286 lb (129.7 kg)   BMI 39.89 kg/m  Vitals:   10/28/18 0814  BP: 128/76  Pulse: 84  Resp: 16  Temp:  98.4 F (36.9 C)  TempSrc: Oral  Weight: 286 lb (129.7 kg)     Physical Exam  General Appearance:    Alert, cooperative, no distress, obese  Eyes:    PERRL, conjunctiva/corneas clear, EOM's intact       Lungs:     Clear to auscultation bilaterally, respirations unlabored  Heart:    Regular rate and rhythm  Neurologic:   Awake, alert, oriented x 3. No apparent focal neurological           defect.           Assessment & Plan    1. Hypertriglyceridemia  - Comprehensive metabolic panel - Lipid panel  2. Essential (primary) hypertension Improved, is losing weight and doing better with his diet. Continue current medications.   3. Vitamin D deficiency  - VITAMIN D 25 Hydroxy (Vit-D Deficiency, Fractures)     The entirety of the information documented in the History of Present Illness, Review of Systems and Physical Exam were personally obtained by me. Portions of this information were initially documented by Ashley Royalty, CMA and reviewed by me for thoroughness and accuracy.   Lelon Huh, MD  [facility]

## 2018-10-28 NOTE — Patient Instructions (Signed)
.   Please review the attached list of medications and notify my office if there are any errors.   . Please bring all of your medications to every appointment so we can make sure that our medication list is the same as yours.   

## 2018-10-29 LAB — LIPID PANEL
Chol/HDL Ratio: 4.5 ratio (ref 0.0–5.0)
Cholesterol, Total: 156 mg/dL (ref 100–199)
HDL: 35 mg/dL — ABNORMAL LOW (ref >39)
LDL Calculated: 94 mg/dL (ref 0–99)
Triglycerides: 135 mg/dL (ref 0–149)
VLDL Cholesterol Cal: 27 mg/dL (ref 5–40)

## 2018-10-29 LAB — COMPREHENSIVE METABOLIC PANEL
ALT: 35 IU/L (ref 0–44)
AST: 26 IU/L (ref 0–40)
Albumin/Globulin Ratio: 1.7 (ref 1.2–2.2)
Albumin: 4.7 g/dL (ref 4.0–5.0)
Alkaline Phosphatase: 98 IU/L (ref 39–117)
BUN/Creatinine Ratio: 14 (ref 9–20)
BUN: 17 mg/dL (ref 6–24)
Bilirubin Total: 0.6 mg/dL (ref 0.0–1.2)
CO2: 22 mmol/L (ref 20–29)
Calcium: 9.9 mg/dL (ref 8.7–10.2)
Chloride: 102 mmol/L (ref 96–106)
Creatinine, Ser: 1.18 mg/dL (ref 0.76–1.27)
GFR calc Af Amer: 83 mL/min/{1.73_m2} (ref >59)
GFR calc non Af Amer: 72 mL/min/{1.73_m2} (ref >59)
Globulin, Total: 2.7 g/dL (ref 1.5–4.5)
Glucose: 96 mg/dL (ref 65–99)
Potassium: 4.9 mmol/L (ref 3.5–5.2)
Sodium: 140 mmol/L (ref 134–144)
Total Protein: 7.4 g/dL (ref 6.0–8.5)

## 2018-10-29 LAB — VITAMIN D 25 HYDROXY (VIT D DEFICIENCY, FRACTURES): Vit D, 25-Hydroxy: 34.1 ng/mL (ref 30.0–100.0)

## 2018-10-30 ENCOUNTER — Telehealth: Payer: Self-pay

## 2018-10-30 NOTE — Telephone Encounter (Signed)
-----   Message from Birdie Sons, MD sent at 10/29/2018  9:55 AM EDT ----- Blood sugar, kidney functions, electrolytes and cholesterol are all normal. Check labs yearly.

## 2018-10-30 NOTE — Telephone Encounter (Signed)
LMTCB 10/30/2018   Thanks,   -Laura 

## 2018-10-30 NOTE — Telephone Encounter (Signed)
Pt advised.   Thanks,   -Rithvik Orcutt  

## 2019-05-03 ENCOUNTER — Ambulatory Visit (INDEPENDENT_AMBULATORY_CARE_PROVIDER_SITE_OTHER): Payer: 59 | Admitting: Family Medicine

## 2019-05-03 ENCOUNTER — Encounter: Payer: Self-pay | Admitting: Family Medicine

## 2019-05-03 ENCOUNTER — Other Ambulatory Visit: Payer: Self-pay

## 2019-05-03 VITALS — BP 155/96 | HR 84 | Temp 96.6°F | Resp 16 | Ht 71.0 in | Wt 295.2 lb

## 2019-05-03 DIAGNOSIS — Z125 Encounter for screening for malignant neoplasm of prostate: Secondary | ICD-10-CM | POA: Diagnosis not present

## 2019-05-03 DIAGNOSIS — Z23 Encounter for immunization: Secondary | ICD-10-CM | POA: Diagnosis not present

## 2019-05-03 DIAGNOSIS — I10 Essential (primary) hypertension: Secondary | ICD-10-CM

## 2019-05-03 DIAGNOSIS — L709 Acne, unspecified: Secondary | ICD-10-CM

## 2019-05-03 DIAGNOSIS — Z0000 Encounter for general adult medical examination without abnormal findings: Secondary | ICD-10-CM | POA: Diagnosis not present

## 2019-05-03 MED ORDER — BENZOYL PEROXIDE-ERYTHROMYCIN 5-3 % EX GEL: Freq: Two times a day (BID) | CUTANEOUS | 0 refills | Status: DC

## 2019-05-03 NOTE — Progress Notes (Signed)
Patient: Travis Abbott, Male    DOB: 03-15-1971, 48 y.o.   MRN: [id number] Visit Date: 05/03/2019  Today's Provider: Lelon Huh, MD   Chief Complaint  Patient presents with  . Annual Exam   Subjective:    Annual physical exam Travis Abbott is a 48 y.o. male who presents today for health maintenance and complete physical. He feels well, patient states that he would like to address rash on his chest. Patient states that he has had rash in question for years and states that he was previously treated by Dr. Nehemiah Massed. He reports he is not exercising . He reports he is sleeping fairly well.  ----------------------------------------------------------------- He also states he has rash across his upper chest. Saw Dr. Nehemiah Massed a few years ago for same and was prescribed cream which states was Benza-something, and that it was very effective.   Review of Systems  Constitutional: Negative for chills, diaphoresis and fever.  HENT: Negative for congestion, ear discharge, ear pain, hearing loss, nosebleeds, sore throat and tinnitus.   Eyes: Negative for photophobia, pain, discharge and redness.  Respiratory: Negative for cough, shortness of breath, wheezing and stridor.   Cardiovascular: Negative for chest pain, palpitations and leg swelling.  Gastrointestinal: Negative for abdominal pain, blood in stool, constipation, diarrhea, nausea and vomiting.  Endocrine: Negative for polydipsia.  Genitourinary: Negative for dysuria, flank pain, frequency, hematuria and urgency.  Musculoskeletal: Negative for back pain, myalgias and neck pain.  Skin: Positive for rash.  Allergic/Immunologic: Negative for environmental allergies.  Neurological: Negative for dizziness, tremors, seizures, weakness and headaches.  Hematological: Does not bruise/bleed easily.  Psychiatric/Behavioral: Negative for hallucinations and suicidal ideas. The patient is not nervous/anxious.   All other systems reviewed and  are negative.   Social History He  reports that he quit smoking about 11 years ago. His smoking use included cigarettes. He has a 10.00 pack-year smoking history. He has quit using smokeless tobacco. He reports current alcohol use. He reports that he does not use drugs. Social History   Socioeconomic History  . Marital status: Single    Spouse name: Not on file  . Number of children: 1  . Years of education: Not on file  . Highest education level: Not on file  Occupational History  . Occupation: Welder  Tobacco Use  . Smoking status: Former Smoker    Packs/day: 1.00    Years: 10.00    Pack years: 10.00    Types: Cigarettes    Quit date: 05/21/2007    Years since quitting: 11.9  . Smokeless tobacco: Former Network engineer and Sexual Activity  . Alcohol use: Yes    Alcohol/week: 0.0 standard drinks    Comment: drinks 2 drinks per month  . Drug use: No  . Sexual activity: Not on file  Other Topics Concern  . Not on file  Social History Narrative  . Not on file   Social Determinants of Health   Financial Resource Strain:   . Difficulty of Paying Living Expenses: Not on file  Food Insecurity:   . Worried About Charity fundraiser in the Last Year: Not on file  . Ran Out of Food in the Last Year: Not on file  Transportation Needs:   . Lack of Transportation (Medical): Not on file  . Lack of Transportation (Non-Medical): Not on file  Physical Activity:   . Days of Exercise per Week: Not on file  . Minutes of Exercise per Session:  Not on file  Stress:   . Feeling of Stress : Not on file  Social Connections:   . Frequency of Communication with Friends and Family: Not on file  . Frequency of Social Gatherings with Friends and Family: Not on file  . Attends Religious Services: Not on file  . Active Member of Clubs or Organizations: Not on file  . Attends Archivist Meetings: Not on file  . Marital Status: Not on file    Patient Active Problem List   Diagnosis  Date Noted  . Family history of colon cancer 07/17/2016  . History of adenomatous polyp of colon 04/12/2015  . Allergic rhinitis 04/11/2015  . ED (erectile dysfunction) of organic origin 04/11/2015  . Pain in joint, shoulder region 04/11/2015  . Burning sensation of the foot 04/11/2015  . Proteinuria 04/11/2015  . Vitamin D deficiency 04/11/2015  . Condyloma acuminatum 09/08/2009  . Neoplasm of uncertain behavior of skin 09/08/2009  . Gouty arthropathy 08/12/2009  . Overweight 03/28/2009  . Essential (primary) hypertension 09/07/2007    Past Surgical History:  Procedure Laterality Date  . ABSCESS DRAINAGE  2013  . APPENDECTOMY  12/07/13   Dr. Bary Castilla, [facility]  . Biopsy of left parotid mass  2008   Benign  . CATARACT EXTRACTION     Left eye: 03/02/2001.  Right eye: 04/07/2000    Family History  Family Status  Relation Name Status  . Mother  Alive  . Father  Alive  . Brother  Alive  . Son  Alive  . MGM  Deceased  . MGF  Deceased  . PGM  Deceased       died from complication of Dementia  . PGF  Deceased  . Mat Uncle  Deceased at age late 16s       colon cancer   His family history includes Dementia in his paternal grandmother; Heart attack in his maternal grandfather; Hypertension in his mother; Kidney failure in his paternal grandfather; Skin cancer in his father; Stomach cancer in his maternal grandmother.     Allergies  Allergen Reactions  . Losartan Potassium     headaches    Previous Medications   CANDESARTAN (ATACAND) 16 MG TABLET    Take 1 tablet (16 mg total) by mouth daily.   CHOLECALCIFEROL (VITAMIN D3) 5000 UNITS TABS    Take 4,000 Units by mouth daily.    EQ ASPIRIN LOW DOSE PO    Take 1 tablet by mouth.   FLUTICASONE (FLONASE) 50 MCG/ACT NASAL SPRAY    Place 2 sprays into both nostrils daily. Reported on 09/18/2015   INDOMETHACIN (INDOCIN) 50 MG CAPSULE    TAKE ONE CAPSULE BY MOUTH 3 TIMES A DAY AS NEEDED FOR PAIN   LORATADINE (CLARITIN) 10 MG TABLET     Take 10 mg by mouth daily.   PSYLLIUM (REGULOID) 0.52 G CAPSULE    Take 2 capsules by mouth daily.   SILDENAFIL (REVATIO) 20 MG TABLET    3-5 tablets as needed prior to intercourse, not to exceed 5 tablets in a day   SILDENAFIL (VIAGRA) 100 MG TABLET    Take 0.5-1 tablets (50-100 mg total) by mouth daily as needed for erectile dysfunction.    Patient Care Team: Birdie Sons, MD as PCP - General (Family Medicine) Bary Castilla Forest Gleason, MD (General Surgery)      Objective:   Vitals: BP (!) 155/96   Pulse 84   Temp (!) 96.6 F (35.9 C) (Oral)  Resp 16   Ht 5\' 11"  (1.803 m)   Wt 295 lb 3.2 oz (133.9 kg)   BMI 41.17 kg/m    Physical Exam   General Appearance:    Obese male. Alert, cooperative, in no acute distress, appears stated age  Head:    Normocephalic, without obvious abnormality, atraumatic  Eyes:    PERRL, conjunctiva/corneas clear, EOM's intact, fundi    benign, both eyes       Ears:    Normal TM's and external ear canals, both ears  Nose:   Nares normal, septum midline, mucosa normal, no drainage   or sinus tenderness  Throat:   Lips, mucosa, and tongue normal; teeth and gums normal  Neck:   Supple, symmetrical, trachea midline, no adenopathy;       thyroid:  No enlargement/tenderness/nodules; no carotid   bruit or JVD  Back:     Symmetric, no curvature, ROM normal, no CVA tenderness  Lungs:     Clear to auscultation bilaterally, respirations unlabored  Chest wall:    No tenderness or deformity. Actinoform rash across upper chest  Heart:    Normal heart rate. Normal rhythm. No murmurs, rubs, or gallops.  S1 and S2 normal  Abdomen:     Soft, non-tender, bowel sounds active all four quadrants,    no masses, no organomegaly  Genitalia:    deferred  Rectal:    deferred  Extremities:   All extremities are intact. No cyanosis or edema  Pulses:   2+ and symmetric all extremities  Skin:   Skin color, texture, turgor normal, no rashes or lesions  Lymph nodes:   Cervical,  supraclavicular, and axillary nodes normal  Neurologic:   CNII-XII intact. Normal strength, sensation and reflexes      throughout    Depression Screen PHQ 2/9 Scores 05/03/2019 04/21/2018 07/17/2016 04/12/2015  PHQ - 2 Score 0 0 0 0  PHQ- 9 Score 3 3 0 0      Assessment & Plan:     Routine Health Maintenance and Physical Exam  Exercise Activities and Dietary recommendations Goals   None     Immunization History  Administered Date(s) Administered  . Influenza,inj,Quad PF,6+ Mos 04/12/2015  . Tdap 03/28/2009    Health Maintenance  Topic Date Due  . HIV Screening  03/28/1984  . TETANUS/TDAP  03/29/2019  . INFLUENZA VACCINE  08/18/2019 (Originally 12/19/2018)  . COLONOSCOPY  09/17/2021     Discussed health benefits of physical activity, and encouraged him to engage in regular exercise appropriate for his age and condition.    -------------------------------------------------------------------- 1. Annual physical exam He refused flu vaccine - Comprehensive metabolic panel - Lipid panel (fasting)  2. Prostate cancer screening  - PSA  3. Essential (primary) hypertension He reports home blood pressure usually better than in-office BP today. He is to work on [employer] and regular exercise. If labs normal will follow up in 6 months for BP check - Comprehensive metabolic panel - Lipid panel (fasting) - EKG 12-Lead  4. Need for prophylactic vaccination using tetanus and diphtheria toxoids adsorbed (Td) vaccine  - Tdap vaccine greater than or equal to 7yo IM  5. Need for shingles vaccine  - Varicella-zoster vaccine IM (Shingrix)  6. Acne, unspecified acne type (chest wall)  - benzoyl peroxide-erythromycin (BENZAMYCIN) gel; Apply topically 2 (two) times daily.  Dispense: 46.6 g; Refill: 0  The entirety of the information documented in the History of Present Illness, Review of Systems and Physical Exam were personally  obtained by me. Portions of this  information were initially documented by Minette Headland, CMA and reviewed by me for thoroughness and accuracy.

## 2019-05-03 NOTE — Patient Instructions (Signed)
.   Please review the attached list of medications and notify my office if there are any errors.   . Please bring all of your medications to every appointment so we can make sure that our medication list is the same as yours.   . It is especially important to get the annual flu vaccine this year. If you haven't had it already, please go to your pharmacy or call the office as soon as possible to schedule you flu shot.  

## 2019-05-04 ENCOUNTER — Telehealth: Payer: Self-pay

## 2019-05-04 LAB — LIPID PANEL
Chol/HDL Ratio: 4.5 ratio (ref 0.0–5.0)
Cholesterol, Total: 154 mg/dL (ref 100–199)
HDL: 34 mg/dL — ABNORMAL LOW (ref >39)
LDL Chol Calc (NIH): 86 mg/dL (ref 0–99)
Triglycerides: 200 mg/dL — ABNORMAL HIGH (ref 0–149)
VLDL Cholesterol Cal: 34 mg/dL (ref 5–40)

## 2019-05-04 LAB — COMPREHENSIVE METABOLIC PANEL
ALT: 35 IU/L (ref 0–44)
AST: 27 IU/L (ref 0–40)
Albumin/Globulin Ratio: 1.6 (ref 1.2–2.2)
Albumin: 4.4 g/dL (ref 4.0–5.0)
Alkaline Phosphatase: 96 IU/L (ref 39–117)
BUN/Creatinine Ratio: 9 (ref 9–20)
BUN: 11 mg/dL (ref 6–24)
Bilirubin Total: 0.7 mg/dL (ref 0.0–1.2)
CO2: 21 mmol/L (ref 20–29)
Calcium: 9.6 mg/dL (ref 8.7–10.2)
Chloride: 103 mmol/L (ref 96–106)
Creatinine, Ser: 1.2 mg/dL (ref 0.76–1.27)
GFR calc Af Amer: 81 mL/min/{1.73_m2} (ref >59)
GFR calc non Af Amer: 70 mL/min/{1.73_m2} (ref >59)
Globulin, Total: 2.8 g/dL (ref 1.5–4.5)
Glucose: 91 mg/dL (ref 65–99)
Potassium: 4.6 mmol/L (ref 3.5–5.2)
Sodium: 140 mmol/L (ref 134–144)
Total Protein: 7.2 g/dL (ref 6.0–8.5)

## 2019-05-04 LAB — PSA: Prostate Specific Ag, Serum: 0.9 ng/mL (ref 0.0–4.0)

## 2019-05-04 NOTE — Telephone Encounter (Signed)
-----   Message from Birdie Sons, MD sent at 05/04/2019  8:07 AM EST ----- Labs are good. Cholesterol is 154. Continue current medications.  Work on current back on sodium in diet and losing weight to get BP down schedule follow up for BP in 5-6 months.

## 2019-05-04 NOTE — Telephone Encounter (Signed)
Patient has been advised and appt made for 10/04/19. KW

## 2019-05-06 ENCOUNTER — Other Ambulatory Visit: Payer: Self-pay | Admitting: Family Medicine

## 2019-06-08 ENCOUNTER — Other Ambulatory Visit: Payer: Self-pay | Admitting: Family Medicine

## 2019-06-09 ENCOUNTER — Other Ambulatory Visit: Payer: Self-pay | Admitting: Family Medicine

## 2019-06-09 MED ORDER — SILDENAFIL CITRATE 100 MG PO TABS: 50.0000 mg | ORAL_TABLET | Freq: Every day | ORAL | 11 refills | Status: DC | PRN

## 2019-06-09 NOTE — Telephone Encounter (Signed)
Patient is requesting refills on Sildenafil 20 mg. to [facility] [place].

## 2019-07-01 NOTE — Progress Notes (Signed)
Patient: Travis Abbott Male    DOB: 08/06/1970   49 y.o.   MRN: [id number] Visit Date: 07/02/2019  Today's Provider: Mar Daring, PA-C   Chief Complaint  Patient presents with  . Cyst   Subjective:     HPI  Patient here with c/o cyst/lump on neck, right posterior side. He reports no pain, tenderness to touch. No pain, numbness, or tingling radiating down the right arm. No neck pain specific to right side. No issues with swallowing or talking.  He noticed the one on top 8 years ago and the one lower to it has been there for 22 years or so. Reports the lower one he had shown to Dr. Rosanna Randy and had some imaging done but cannot remember. No changes in shape, size, texture, or overlying skin.   Allergies  Allergen Reactions  . Losartan Potassium     headaches     Current Outpatient Medications:  .  benzoyl peroxide-erythromycin (BENZAMYCIN) gel, Apply topically 2 (two) times daily., Disp: 46.6 g, Rfl: 0 .  candesartan (ATACAND) 16 MG tablet, TAKE 1 TABLET BY MOUTH EVERY DAY, Disp: 90 tablet, Rfl: 4 .  Cholecalciferol (VITAMIN D3) 5000 units TABS, Take 4,000 Units by mouth daily. , Disp: , Rfl:  .  fluticasone (FLONASE) 50 MCG/ACT nasal spray, Place 2 sprays into both nostrils daily. Reported on 09/18/2015, Disp: 16 g, Rfl: 1 .  indomethacin (INDOCIN) 50 MG capsule, TAKE ONE CAPSULE BY MOUTH 3 TIMES A DAY AS NEEDED FOR PAIN, Disp: 30 capsule, Rfl: 5 .  loratadine (CLARITIN) 10 MG tablet, Take 10 mg by mouth daily., Disp: , Rfl:  .  psyllium (REGULOID) 0.52 G capsule, Take 2 capsules by mouth daily., Disp: , Rfl:  .  sildenafil (VIAGRA) 100 MG tablet, Take 0.5-1 tablets (50-100 mg total) by mouth daily as needed for erectile dysfunction., Disp: 5 tablet, Rfl: 11 .  EQ ASPIRIN LOW DOSE PO, Take 1 tablet by mouth., Disp: , Rfl:   Review of Systems  Constitutional: Negative.   Respiratory: Negative.   Cardiovascular: Negative.   Musculoskeletal: Negative.   Skin:   Lump on neck  Neurological: Negative.     Social History   Tobacco Use  . Smoking status: Former Smoker    Packs/day: 1.00    Years: 10.00    Pack years: 10.00    Types: Cigarettes    Quit date: 05/21/2007    Years since quitting: 12.1  . Smokeless tobacco: Former Network engineer Use Topics  . Alcohol use: Yes    Alcohol/week: 0.0 standard drinks    Comment: drinks 2 drinks per month      Objective:   BP (!) 142/90 (BP Location: Left Arm, Patient Position: Sitting, Cuff Size: Large)   Pulse 84   Temp 97.6 F (36.4 C) (Temporal)   Resp 16   Wt 294 lb 3.2 oz (133.4 kg)   SpO2 99%   BMI 41.03 kg/m  Vitals:   07/02/19 1648  BP: (!) 142/90  Pulse: 84  Resp: 16  Temp: 97.6 F (36.4 C)  TempSrc: Temporal  SpO2: 99%  Weight: 294 lb 3.2 oz (133.4 kg)  Body mass index is 41.03 kg/m.   Physical Exam Vitals reviewed.  Constitutional:      General: He is not in acute distress.    Appearance: Normal appearance. He is well-developed. He is obese. He is not ill-appearing.  HENT:     Head: Normocephalic and  atraumatic.  Eyes:     General: No scleral icterus.    Conjunctiva/sclera: Conjunctivae normal.  Neck:     Thyroid: No thyroid mass, thyromegaly or thyroid tenderness.     Trachea: Trachea normal.   Pulmonary:     Effort: Pulmonary effort is normal. No respiratory distress.  Musculoskeletal:     Cervical back: Normal range of motion and neck supple. No pain with movement, spinous process tenderness or muscular tenderness. Normal range of motion.  Lymphadenopathy:     Cervical: No cervical adenopathy.  Neurological:     Mental Status: He is alert.  Psychiatric:        Mood and Affect: Mood normal.        Behavior: Behavior normal.        Thought Content: Thought content normal.        Judgment: Judgment normal.      No results found for any visits on 07/02/19.     Assessment & Plan    1. Epidermal cyst Suspect cyst due to the way they are shaped and  how they feel. Would expect benign in nature to longevity of them being unchanged. Patient will continue to watch for changes. If they begin to enlarge in size, cause pain, have overlying skin changes, or become infected looking will obtain US at that time.      Mar Daring, PA-C  [facility]

## 2019-07-02 ENCOUNTER — Encounter: Payer: Self-pay | Admitting: Physician Assistant

## 2019-07-02 ENCOUNTER — Other Ambulatory Visit: Payer: Self-pay

## 2019-07-02 ENCOUNTER — Ambulatory Visit (INDEPENDENT_AMBULATORY_CARE_PROVIDER_SITE_OTHER): Payer: BC Managed Care – PPO | Admitting: Physician Assistant

## 2019-07-02 VITALS — BP 142/90 | HR 84 | Temp 97.6°F | Resp 16 | Wt 294.2 lb

## 2019-07-02 DIAGNOSIS — L720 Epidermal cyst: Secondary | ICD-10-CM | POA: Diagnosis not present

## 2019-07-02 NOTE — Patient Instructions (Signed)
Epidermal Cyst  An epidermal cyst is a sac made of skin tissue. The sac contains a substance called keratin. Keratin is a protein that is normally secreted through the hair follicles. When keratin becomes trapped in the top layer of skin (epidermis), it can form an epidermal cyst. Epidermal cysts can be found anywhere on your body. These cysts are usually harmless (benign), and they may not cause symptoms unless they become infected. What are the causes? This condition may be caused by:  A blocked hair follicle.  A hair that curls and re-enters the skin instead of growing straight out of the skin (ingrown hair).  A blocked pore.  Irritated skin.  An injury to the skin.  Certain conditions that are passed along from parent to child (inherited).  Human papillomavirus (HPV).  Long-term (chronic) sun damage to the skin. What increases the risk? The following factors may make you more likely to develop an epidermal cyst:  Having acne.  Being overweight.  Being 30-40 years old. What are the signs or symptoms? The only symptom of this condition may be a small, painless lump underneath the skin. When an epidermal cyst ruptures, it may become infected. Symptoms may include:  Redness.  Inflammation.  Tenderness.  Warmth.  Fever.  Keratin draining from the cyst. Keratin is grayish-white, bad-smelling substance.  Pus draining from the cyst. How is this diagnosed? This condition is diagnosed with a physical exam.  In some cases, you may have a sample of tissue (biopsy) taken from your cyst to be examined under a microscope or tested for bacteria.  You may be referred to a health care provider who specializes in skin care (dermatologist). How is this treated? In many cases, epidermal cysts go away on their own without treatment. If a cyst becomes infected, treatment may include:  Opening and draining the cyst, done by a health care provider. After draining, minor surgery to  remove the rest of the cyst may be done.  Antibiotic medicine.  Injections of medicines (steroids) that help to reduce inflammation.  Surgery to remove the cyst. Surgery may be done if the cyst: ? Becomes large. ? Bothers you. ? Has a chance of turning into cancer.  Do not try to open a cyst yourself. Follow these instructions at home:  Take over-the-counter and prescription medicines only as told by your health care provider.  If you were prescribed an antibiotic medicine, take it it as told by your health care provider. Do not stop using the antibiotic even if you start to feel better.  Keep the area around your cyst clean and dry.  Wear loose, dry clothing.  Avoid touching your cyst.  Check your cyst every day for signs of infection. Check for: ? Redness, swelling, or pain. ? Fluid or blood. ? Warmth. ? Pus or a bad smell.  Keep all follow-up visits as told by your health care provider. This is important. How is this prevented?  Wear clean, dry, clothing.  Avoid wearing tight clothing.  Keep your skin clean and dry. Take showers or baths every day. Contact a health care provider if:  Your cyst develops symptoms of infection.  Your condition is not improving or is getting worse.  You develop a cyst that looks different from other cysts you have had.  You have a fever. Get help right away if:  Redness spreads from the cyst into the surrounding area. Summary  An epidermal cyst is a sac made of skin tissue. These cysts are   usually harmless (benign), and they may not cause symptoms unless they become infected.  If a cyst becomes infected, treatment may include surgery to open and drain the cyst, or to remove it. Treatment may also include medicines by mouth or through an injection.  Take over-the-counter and prescription medicines only as told by your health care provider. If you were prescribed an antibiotic medicine, take it as told by your health care  provider. Do not stop using the antibiotic even if you start to feel better.  Contact a health care provider if your condition is not improving or is getting worse.  Keep all follow-up visits as told by your health care provider. This is important. This information is not intended to replace advice given to you by your health care provider. Make sure you discuss any questions you have with your health care provider. Document Revised: 08/27/2018 Document Reviewed: 11/17/2017 Elsevier Patient Education  2020 Elsevier Inc[employer]  

## 2019-08-30 ENCOUNTER — Other Ambulatory Visit: Payer: Self-pay

## 2019-08-30 ENCOUNTER — Ambulatory Visit (INDEPENDENT_AMBULATORY_CARE_PROVIDER_SITE_OTHER): Payer: BC Managed Care – PPO | Admitting: Dermatology

## 2019-08-30 DIAGNOSIS — L819 Disorder of pigmentation, unspecified: Secondary | ICD-10-CM | POA: Diagnosis not present

## 2019-08-30 DIAGNOSIS — A630 Anogenital (venereal) warts: Secondary | ICD-10-CM | POA: Diagnosis not present

## 2019-08-30 MED ORDER — IMIQUIMOD 3.75 % EX CREA: TOPICAL_CREAM | CUTANEOUS | 1 refills | Status: DC

## 2019-08-30 NOTE — Progress Notes (Signed)
   Follow-Up Visit   Subjective  Travis Abbott is a 49 y.o. male who presents for the following: Follow-up (Condyloma in groin. Treated previously with LN2. Patient has noticed a few new spots. ).   The following portions of the chart were reviewed this encounter and updated as appropriate: Tobacco  Allergies  Meds  Problems  Med Hx  Surg Hx  Fam Hx      Review of Systems: No other skin or systemic complaints.  Objective  Well appearing patient in no apparent distress; mood and affect are within normal limits.  A focused examination was performed including groin and genital area. Relevant physical exam findings are noted in the Assessment and Plan.  Objective  Penis; scrotum; pubic and groin areas: Discoloration from previous treatment otherwise clear.  Assessment & Plan  Condyloma Pubic; penis; scrotum and groin areas Currently clear today with some dyschromia. Discussed potential for recurrence in future.  Watch areas.  Discussed treatment with Candida Ag. Consider HPV vaccine, Gardisil 9 - recommend it. He is to discuss with his PCP.   Although these vaccines are to be used for prevention, there is anectdotal evidence that it helps with already infected individuals (and I have seen benefits in some of my patients) If patient notices new spots may start imiquimod to AA's 3-7 days a week as tolerated until clear.    Imiquimod 3.75 % CREA - Pubic   Return if symptoms worsen or fail to improve.   Graciella Belton, RMA, am acting as scribe for Sarina Ser, MD . Documentation: I have reviewed the above documentation for accuracy and completeness, and I agree with the above.  Sarina Ser, MD

## 2019-08-30 NOTE — Patient Instructions (Addendum)
Recommend daily broad spectrum sunscreen SPF 30+ to sun-exposed areas, reapply every 2 hours as needed. Call for new or changing lesions.  

## 2019-08-31 ENCOUNTER — Encounter: Payer: Self-pay | Admitting: Dermatology

## 2019-09-07 ENCOUNTER — Telehealth: Payer: Self-pay

## 2019-09-07 NOTE — Telephone Encounter (Signed)
[facility] 3.75[facility] on backorder/unavailable.

## 2019-09-07 NOTE — Telephone Encounter (Signed)
OK.  Pt may fill when available.

## 2019-09-08 NOTE — Telephone Encounter (Signed)
Patient advised of information per Dr. Kowalski.  

## 2019-09-08 NOTE — Telephone Encounter (Signed)
Left message for patient to return my call regarding medication.

## 2019-09-28 ENCOUNTER — Telehealth: Payer: Self-pay

## 2019-09-28 NOTE — Telephone Encounter (Signed)
[facility] called this morning since [facility] and stated Imiquimod is still on backorder. He has asked for a alternative medication.

## 2019-09-28 NOTE — Telephone Encounter (Signed)
Advise pt of this. He can get either if he wants. I'm not sure that he needed anything when I saw him last visit, but I think he wanted medication just in case. There is one other medication he could use that is Rx and that is Podofilox gel  - use prn to aa warts disp trade 1 rf. Can send that to see if cheaper.

## 2019-09-28 NOTE — Telephone Encounter (Signed)
Called and spoke with pharmacist at [facility] for help. Veregen is very expensive and hard to get. He said this will cost patient around $800/900. Then I asked about the Imiquimod 3.75% and he said he can order this but will cost patient around $700.

## 2019-09-28 NOTE — Telephone Encounter (Signed)
May send Veregen - apply daily to aa prn disp trade size with 6 refills.

## 2019-09-29 MED ORDER — PODOFILOX 0.5 % EX GEL: Freq: Every evening | CUTANEOUS | 0 refills | Status: DC

## 2019-09-29 NOTE — Telephone Encounter (Signed)
Patient advised of information per Dr. Nehemiah Massed. RX sent in. He will see how much [facility].

## 2019-09-29 NOTE — Telephone Encounter (Signed)
Left message for patient to return my call.

## 2019-10-04 ENCOUNTER — Ambulatory Visit: Payer: Self-pay | Admitting: Family Medicine

## 2019-10-12 ENCOUNTER — Other Ambulatory Visit: Payer: Self-pay

## 2019-10-12 ENCOUNTER — Ambulatory Visit (INDEPENDENT_AMBULATORY_CARE_PROVIDER_SITE_OTHER): Payer: BC Managed Care – PPO | Admitting: Family Medicine

## 2019-10-12 ENCOUNTER — Encounter: Payer: Self-pay | Admitting: Family Medicine

## 2019-10-12 VITALS — BP 114/86 | HR 81 | Temp 96.9°F | Wt 295.0 lb

## 2019-10-12 DIAGNOSIS — B001 Herpesviral vesicular dermatitis: Secondary | ICD-10-CM

## 2019-10-12 DIAGNOSIS — L709 Acne, unspecified: Secondary | ICD-10-CM | POA: Diagnosis not present

## 2019-10-12 MED ORDER — VALACYCLOVIR HCL 1 G PO TABS: 1000.0000 mg | ORAL_TABLET | Freq: Two times a day (BID) | ORAL | 0 refills | Status: DC

## 2019-10-12 MED ORDER — BENZOYL PEROXIDE-ERYTHROMYCIN 5-3 % EX GEL: Freq: Two times a day (BID) | CUTANEOUS | 0 refills | Status: DC

## 2019-10-12 NOTE — Progress Notes (Signed)
Acute Office Visit  Subjective:    Patient ID: Travis Abbott, male    DOB: 11-Dec-1970, 49 y.o.   MRN: [id number]  Chief Complaint  Patient presents with  . Blister    cold sore    HPI Patient is in today for cold sore.  The patient reports that he woke up this morning and noticed the sore on his lower left lip.  He states he has gotten them in the past but has not had one in quite a while.  Past Medical History:  Diagnosis Date  . Abscess of scrotum 05/24/2013    Past Surgical History:  Procedure Laterality Date  . ABSCESS DRAINAGE  2013  . APPENDECTOMY  12/07/13   Dr. Bary Castilla, [facility]  . Biopsy of left parotid mass  2008   Benign  . CATARACT EXTRACTION     Left eye: 03/02/2001.  Right eye: 04/07/2000    Family History  Problem Relation Age of Onset  . Hypertension Mother   . Skin cancer Father   . Stomach cancer Maternal Grandmother   . Heart attack Maternal Grandfather   . Dementia Paternal Grandmother   . Kidney failure Paternal Grandfather     Social History   Socioeconomic History  . Marital status: Single    Spouse name: Not on file  . Number of children: 1  . Years of education: Not on file  . Highest education level: Not on file  Occupational History  . Occupation: Welder  Tobacco Use  . Smoking status: Former Smoker    Packs/day: 1.00    Years: 10.00    Pack years: 10.00    Types: Cigarettes    Quit date: 05/21/2007    Years since quitting: 12.4  . Smokeless tobacco: Former Network engineer and Sexual Activity  . Alcohol use: Yes    Alcohol/week: 0.0 standard drinks    Comment: drinks 2 drinks per month  . Drug use: No  . Sexual activity: Not on file  Other Topics Concern  . Not on file  Social History Narrative  . Not on file   Social Determinants of Health   Financial Resource Strain:   . Difficulty of Paying Living Expenses:   Food Insecurity:   . Worried About Charity fundraiser in the Last Year:   . Arboriculturist in the Last  Year:   Transportation Needs:   . Film/video editor (Medical):   Marland Kitchen Lack of Transportation (Non-Medical):   Physical Activity:   . Days of Exercise per Week:   . Minutes of Exercise per Session:   Stress:   . Feeling of Stress :   Social Connections:   . Frequency of Communication with Friends and Family:   . Frequency of Social Gatherings with Friends and Family:   . Attends Religious Services:   . Active Member of Clubs or Organizations:   . Attends Archivist Meetings:   Marland Kitchen Marital Status:   Intimate Partner Violence:   . Fear of Current or Ex-Partner:   . Emotionally Abused:   Marland Kitchen Physically Abused:   . Sexually Abused:     Outpatient Medications Prior to Visit  Medication Sig Dispense Refill  . benzoyl peroxide-erythromycin (BENZAMYCIN) gel Apply topically 2 (two) times daily. 46.6 g 0  . candesartan (ATACAND) 16 MG tablet TAKE 1 TABLET BY MOUTH EVERY DAY 90 tablet 4  . Cholecalciferol (VITAMIN D3) 5000 units TABS Take 4,000 Units by mouth daily.     Marland Kitchen  EQ ASPIRIN LOW DOSE PO Take 1 tablet by mouth.    . fluticasone (FLONASE) 50 MCG/ACT nasal spray Place 2 sprays into both nostrils daily. Reported on 09/18/2015 16 g 1  . Imiquimod 3.75 % CREA Apply to affected areas 3-7 days a week as tolerated until clear if patient notices new spots. 28 g 1  . indomethacin (INDOCIN) 50 MG capsule TAKE ONE CAPSULE BY MOUTH 3 TIMES A DAY AS NEEDED FOR PAIN 30 capsule 5  . loratadine (CLARITIN) 10 MG tablet Take 10 mg by mouth daily.    . podofilox (CONDYLOX) 0.5 % gel Apply topically at bedtime. 3.5 g 0  . psyllium (REGULOID) 0.52 G capsule Take 2 capsules by mouth daily.    . sildenafil (VIAGRA) 100 MG tablet Take 0.5-1 tablets (50-100 mg total) by mouth daily as needed for erectile dysfunction. 5 tablet 11   No facility-administered medications prior to visit.    Allergies  Allergen Reactions  . Losartan Potassium     headaches    Review of Systems  Constitutional:  Negative for chills and fever.  HENT: Negative for congestion, sinus pressure and sore throat.       Objective:    Physical Exam Constitutional:      General: He is not in acute distress.    Appearance: He is well-developed.  HENT:     Head: Normocephalic and atraumatic.     Right Ear: Hearing normal.     Left Ear: Hearing normal.     Nose: Nose normal.     Mouth/Throat:     Comments: Cluster of tender blisters on the left lateral lower lip and right lateral upper lip with surrounding erythema. No local lymphadenopathy. Eyes:     General: Lids are normal. No scleral icterus.       Right eye: No discharge.        Left eye: No discharge.     Conjunctiva/sclera: Conjunctivae normal.  Pulmonary:     Effort: Pulmonary effort is normal. No respiratory distress.  Musculoskeletal:        General: Normal range of motion.  Skin:    Findings: Rash present. No lesion.     Comments: Small pinpoint pimples scattered across chest.  Neurological:     Mental Status: He is alert and oriented to person, place, and time.  Psychiatric:        Speech: Speech normal.        Behavior: Behavior normal.        Thought Content: Thought content normal.     BP 114/86 (BP Location: Right Arm, Patient Position: Sitting, Cuff Size: Large)   Pulse 81   Temp (!) 96.9 F (36.1 C) (Skin)   Wt 295 lb (133.8 kg)   SpO2 97%   BMI 41.14 kg/m  Wt Readings from Last 3 Encounters:  10/12/19 295 lb (133.8 kg)  07/02/19 294 lb 3.2 oz (133.4 kg)  05/03/19 295 lb 3.2 oz (133.9 kg)    Health Maintenance Due  Topic Date Due  . COVID-19 Vaccine (1) Never done  . HIV Screening  Never done    There are no preventive care reminders to display for this patient.   Lab Results  Component Value Date   TSH 1.59 11/12/2013   Lab Results  Component Value Date   WBC 11.6 (H) 12/07/2013   HGB 16.4 12/07/2013   HCT 50.2 12/07/2013   MCV 91 12/07/2013   PLT 184 12/07/2013   Lab Results  Component  Value  Date   NA 140 05/03/2019   K 4.6 05/03/2019   CO2 21 05/03/2019   GLUCOSE 91 05/03/2019   BUN 11 05/03/2019   CREATININE 1.20 05/03/2019   BILITOT 0.7 05/03/2019   ALKPHOS 96 05/03/2019   AST 27 05/03/2019   ALT 35 05/03/2019   PROT 7.2 05/03/2019   ALBUMIN 4.4 05/03/2019   CALCIUM 9.6 05/03/2019   ANIONGAP 8 12/07/2013   Lab Results  Component Value Date   CHOL 154 05/03/2019   Lab Results  Component Value Date   HDL 34 (L) 05/03/2019   Lab Results  Component Value Date   LDLCALC 86 05/03/2019   Lab Results  Component Value Date   TRIG 200 (H) 05/03/2019   Lab Results  Component Value Date   CHOLHDL 4.5 05/03/2019   No results found for: HGBA1C     Assessment & Plan:   1. Herpes labialis Onset of slightly tender and ticklish blisters on lips today. No recent illness or fever. Has been working in the sun recently and may have had UV light trigger. Will treat with Valtrex 1000 mg 2 tablets BID for 2 days. Should use sunscreen to protect from further recurrences. - valACYclovir (VALTREX) 1000 MG tablet; Take 1 tablet (1,000 mg total) by mouth 2 (two) times daily.  Dispense: 20 tablet; Refill: 0  2. Acne, unspecified acne type Few scattered pimples across chest. No local lymphadenopathy. Will refill the Benzamycin Gel that has helped control flares in the past. Recheck prn. - benzoyl peroxide-erythromycin (BENZAMYCIN) gel; Apply topically 2 (two) times daily.  Dispense: 46.6 g; Refill: 0    No orders of the defined types were placed in this encounter.    Juluis Mire, CMA

## 2019-10-12 NOTE — Patient Instructions (Signed)
Cold Sore  A cold sore, also called a fever blister, is a small, fluid-filled sore that forms inside the mouth or on the lips, gums, nose, chin, or cheeks. Cold sores can spread to other parts of the body, such as the eyes or fingers. In some people who have other medical conditions, cold sores can spread to multiple other body sites, including the genitals. Cold sores can spread from person to person (are contagious) until the sores crust over completely. Most cold sores go away within 2 weeks. What are the causes? Cold sores are caused by an infection from a common type of herpes simplex virus (HSV-1). HSV-1 is closely related to the HSV-2virus, which is the virus that causes genital herpes, but these viruses are not the same. Once a person is infected with HSV-1, the virus remains permanently in the body. HSV-1 is spread from person to person through close contact, such as through kissing, touching the affected area, or sharing personal items such as lip balm, razors, a drinking glass, or eating utensils. What increases the risk? You are more likely to develop this condition if you:  Are tired, stressed, or sick.  Are menstruating.  Are pregnant.  Take certain medicines.  Are exposed to cold weather or too much sun. What are the signs or symptoms? Symptoms of a cold sore outbreak go through different stages. These are the stages of a cold sore:  Tingling, itching, or burning is felt 1-2 days before the outbreak.  Fluid-filled blisters appear on the lips, inside the mouth, on the nose, or on the cheeks.  The blisters start to ooze clear fluid.  The blisters dry up, and a yellow crust appears in their place.  The crust falls off. In some cases, other symptoms can develop during a cold sore outbreak. These can include:  Fever.  Sore throat.  Headache.  Muscle aches.  Swollen neck glands. How is this diagnosed? This condition is diagnosed based on your medical history and a  physical exam. Your health care provider may do a blood test or may swab some fluid from your sore and then examine the swab in the lab. How is this treated? There is no cure for cold sores or HSV-1. There is also no vaccine for HSV-1. Most cold sores go away on their own without treatment within 2 weeks. Medicines cannot make the infection go away, but your health care provider may prescribe medicines to:  Help relieve some of the pain associated with the sores.  Work to stop the virus from multiplying.  Shorten healing time. Medicines may be in the form of creams, gels, pills, or a shot. Follow these instructions at home: Medicines  Take or apply over-the-counter and prescription medicines only as told by your health care provider.  Use a cotton-tip swab to apply creams or gels to your sores.  Ask your health care provider if you can take lysine supplements. Research has found that lysine may help heal the cold sore faster and prevent outbreaks. Sore care   Do not touch the sores or pick the scabs.  Wash your hands often. Do not touch your eyes without washing your hands first.  Keep the sores clean and dry.  If directed, apply ice to the sores: ? Put ice in a plastic bag. ? Place a towel between your skin and the bag. ? Leave the ice on for 20 minutes, 2-3 times a day. Eating and drinking  Eat a soft, bland diet. Avoid eating   hot, cold, or salty foods.  Use a straw if it hurts to drink out of a glass.  Eat foods that are rich in lysine, such as meat, fish, and dairy products.  Avoid sugary foods, chocolates, nuts, and grains. These foods are rich in a nutrient called arginine, which can cause the virus to multiply. Lifestyle  Do not kiss, have oral sex, or share personal items until your sores heal.  Stress, poor sleep, and being out in the sun can trigger outbreaks. Make sure you: ? Do activities that help you relax, such as deep breathing exercises or  meditation. ? Get enough sleep. ? Apply sunscreen on your lips before you go out in the sun. Contact a health care provider if:  You have symptoms for more than 2 weeks.  You have pus coming from the sores.  You have redness that is spreading.  You have pain or irritation in your eye.  You get sores on your genitals.  Your sores do not heal within 2 weeks.  You have frequent cold sore outbreaks. Get help right away if you have:  A fever and your symptoms suddenly get worse.  A headache and confusion.  Fatigue or loss of appetite.  A stiff neck or sensitivity to light. Summary  A cold sore, also called a fever blister, is a small, fluid-filled sore that forms inside the mouth or on the lips, gums, nose, chin, or cheeks.  Most cold sores go away on their own without treatment within 2 weeks. Your health care provider may prescribe medicines to help relieve some of the pain, work to stop the virus from multiplying, and shorten healing time.  Wash your hands often. Do not touch your eyes without washing your hands first.  Do not kiss, have oral sex, or share personal items until your sores heal.  Contact a health care provider if your sores do not heal within 2 weeks. This information is not intended to replace advice given to you by your health care provider. Make sure you discuss any questions you have with your health care provider. Document Revised: 08/26/2018 Document Reviewed: 10/06/2017 Elsevier Patient Education  2020 Elsevier Inc.   

## 2019-10-22 ENCOUNTER — Encounter: Payer: Self-pay | Admitting: Family Medicine

## 2019-10-22 ENCOUNTER — Ambulatory Visit (INDEPENDENT_AMBULATORY_CARE_PROVIDER_SITE_OTHER): Payer: BC Managed Care – PPO | Admitting: Family Medicine

## 2019-10-22 ENCOUNTER — Other Ambulatory Visit: Payer: Self-pay

## 2019-10-22 VITALS — BP 134/87 | HR 74 | Temp 97.3°F | Resp 16 | Wt 292.0 lb

## 2019-10-22 DIAGNOSIS — I10 Essential (primary) hypertension: Secondary | ICD-10-CM | POA: Diagnosis not present

## 2019-10-22 DIAGNOSIS — M7551 Bursitis of right shoulder: Secondary | ICD-10-CM | POA: Diagnosis not present

## 2019-10-22 DIAGNOSIS — Z23 Encounter for immunization: Secondary | ICD-10-CM

## 2019-10-22 MED ORDER — MELOXICAM 15 MG PO TABS: 15.0000 mg | ORAL_TABLET | Freq: Every day | ORAL | 1 refills | Status: DC | PRN

## 2019-10-22 NOTE — Patient Instructions (Signed)
.   Apply ice pack to affected shoulder once or twice a day   Take meloxicam once every day for about two week, then just take as needed for shoulder pain

## 2019-10-22 NOTE — Progress Notes (Signed)
I,Roshena L Chambers,acting as a scribe for Lelon Huh, MD.,have documented all relevant documentation on the behalf of Lelon Huh, MD,as directed by  Lelon Huh, MD while in the presence of Lelon Huh, MD.  Established patient visit   Patient: Travis Abbott   DOB: 05-19-1971   49 y.o. Male  MRN: [id number] Visit Date: 10/22/2019  Today's healthcare provider: Lelon Huh, MD   Chief Complaint  Patient presents with  . Hypertension  . Shoulder Pain   Subjective    HPI Hypertension, follow-up  BP Readings from Last 3 Encounters:  10/22/19 134/87  10/12/19 114/86  07/02/19 (!) 142/90   Wt Readings from Last 3 Encounters:  10/22/19 292 lb (132.5 kg)  10/12/19 295 lb (133.8 kg)  07/02/19 294 lb 3.2 oz (133.4 kg)     He was last seen for hypertension 5 months ago.  BP at that visit was 155/96. Management since that visit includes counseling patient to cut back on sodium in his diet and work on weight loss.  He reports good compliance with treatment. He is not having side effects.  He is following a Regular diet. He is not exercising. He does not smoke.  Use of agents associated with hypertension: NSAIDS.   Outside blood pressures are not checked. Symptoms: No chest pain No chest pressure  No palpitations No syncope  No dyspnea No orthopnea  No paroxysmal nocturnal dyspnea No lower extremity edema   Pertinent labs: Lab Results  Component Value Date   CHOL 154 05/03/2019   HDL 34 (L) 05/03/2019   LDLCALC 86 05/03/2019   TRIG 200 (H) 05/03/2019   CHOLHDL 4.5 05/03/2019   Lab Results  Component Value Date   NA 140 05/03/2019   K 4.6 05/03/2019   CREATININE 1.20 05/03/2019   GFRNONAA 70 05/03/2019   GFRAA 81 05/03/2019   GLUCOSE 91 05/03/2019     The 10-year ASCVD risk score Mikey Bussing DC Jr., et al., 2013) is: 4.6%   ---------------------------------------------------------------------------------------------------  Shoulder pain: Patient  complains of worsening chronic shoulder pain. Pain is aggravated when lifting objects. He has been seen at [facility] over 10 years ago and was given a cortisone injection. He reports the injection worked for about 7 months, but and the n the pain returned. Affects both shoulders but right is worse. ibuprofn helps.     Medications: Outpatient Medications Prior to Visit  Medication Sig  . benzoyl peroxide-erythromycin (BENZAMYCIN) gel Apply topically 2 (two) times daily.  . candesartan (ATACAND) 16 MG tablet TAKE 1 TABLET BY MOUTH EVERY DAY  . Cholecalciferol (VITAMIN D3) 5000 units TABS Take 4,000 Units by mouth daily.   Noelle Penner ASPIRIN LOW DOSE PO Take 1 tablet by mouth.  . fluticasone (FLONASE) 50 MCG/ACT nasal spray Place 2 sprays into both nostrils daily. Reported on 09/18/2015  . Imiquimod 3.75 % CREA Apply to affected areas 3-7 days a week as tolerated until clear if patient notices new spots.  . indomethacin (INDOCIN) 50 MG capsule TAKE ONE CAPSULE BY MOUTH 3 TIMES A DAY AS NEEDED FOR PAIN  . loratadine (CLARITIN) 10 MG tablet Take 10 mg by mouth daily.  . podofilox (CONDYLOX) 0.5 % gel Apply topically at bedtime.  . psyllium (REGULOID) 0.52 G capsule Take 2 capsules by mouth daily.  . sildenafil (VIAGRA) 100 MG tablet Take 0.5-1 tablets (50-100 mg total) by mouth daily as needed for erectile dysfunction.  . valACYclovir (VALTREX) 1000 MG tablet Take 1 tablet (1,000 mg  total) by mouth 2 (two) times daily.   No facility-administered medications prior to visit.    Review of Systems  Constitutional: Negative for appetite change, chills and fever.  Respiratory: Negative for chest tightness, shortness of breath and wheezing.   Cardiovascular: Negative for chest pain and palpitations.  Gastrointestinal: Negative for abdominal pain, nausea and vomiting.  Musculoskeletal: Positive for arthralgias.     Objective    BP 134/87 (BP Location: Right Arm, Patient Position:  Sitting, Cuff Size: Large)   Pulse 74   Temp (!) 97.3 F (36.3 C) (Temporal)   Resp 16   Wt 292 lb (132.5 kg)   BMI 40.73 kg/m   Physical Exam  General appearance: Obese male, cooperative and in no acute distress Head: Normocephalic, without obvious abnormality, atraumatic Respiratory: Respirations even and unlabored, normal respiratory rate Extremities: Positive impingement sign. Tender over left acromium. No swelling or erythema.     Assessment & Plan     1. Essential (primary) hypertension Improved. Continue current medications.    2. Subacromial bursitis of right shoulder joint Recommend regular ice packs. 1-2 week course of meloxicam, then prn. Consider orthopedic referral if not improving.   3. Need for shingles vaccine  - Varicella-zoster vaccine IM #2  Follow up CPE and htn 6 months.       The entirety of the information documented in the History of Present Illness, Review of Systems and Physical Exam were personally obtained by me. Portions of this information were initially documented by the CMA and reviewed by me for thoroughness and accuracy.      Lelon Huh, MD  [facility] [phone] (phone) [phone] (fax)  [place]

## 2019-11-15 ENCOUNTER — Encounter: Payer: Self-pay | Admitting: Family Medicine

## 2019-12-03 ENCOUNTER — Other Ambulatory Visit: Payer: Self-pay | Admitting: Family Medicine

## 2019-12-03 DIAGNOSIS — B001 Herpesviral vesicular dermatitis: Secondary | ICD-10-CM

## 2019-12-03 MED ORDER — VALACYCLOVIR HCL 1 G PO TABS: 1000.0000 mg | ORAL_TABLET | Freq: Two times a day (BID) | ORAL | 5 refills | Status: DC

## 2019-12-24 ENCOUNTER — Other Ambulatory Visit: Payer: Self-pay | Admitting: Family Medicine

## 2019-12-24 ENCOUNTER — Telehealth: Payer: Self-pay | Admitting: Family Medicine

## 2019-12-24 DIAGNOSIS — M7551 Bursitis of right shoulder: Secondary | ICD-10-CM

## 2019-12-24 MED ORDER — SILDENAFIL CITRATE 100 MG PO TABS: 50.0000 mg | ORAL_TABLET | Freq: Every day | ORAL | 11 refills | Status: DC | PRN

## 2019-12-24 NOTE — Telephone Encounter (Signed)
Have sent prescription to [facility], please cancel that [facility]

## 2019-12-24 NOTE — Telephone Encounter (Signed)
[facility] has been using [facility] for [facility]il 100 mg. Its $80.00 there and he found out he can get it at [facility] for $15.00.   Can you please cancel the RX at [facility] [address]  and send the new RX into [facility] on [address].  He would like to get it today if possible.

## 2019-12-24 NOTE — Telephone Encounter (Signed)
Contacted [facility] to [facility] RX. Patient advised Rx was resent to [facility]

## 2020-03-01 ENCOUNTER — Other Ambulatory Visit: Payer: Self-pay

## 2020-03-01 MED ORDER — CANDESARTAN CILEXETIL 16 MG PO TABS: 16.0000 mg | ORAL_TABLET | Freq: Every day | ORAL | 4 refills | Status: DC

## 2020-03-01 NOTE — Telephone Encounter (Signed)
Patient would like to have his prescription of Candesartan 16 mg. Sent to [facility] on [address]. Please.

## 2020-03-01 NOTE — Telephone Encounter (Signed)
RX sent to [facility]

## 2020-04-12 ENCOUNTER — Ambulatory Visit: Payer: BC Managed Care – PPO | Admitting: Adult Health

## 2020-04-18 ENCOUNTER — Telehealth: Payer: Self-pay

## 2020-04-18 NOTE — Telephone Encounter (Signed)
Please advise patient on the billing question.

## 2020-04-18 NOTE — Telephone Encounter (Signed)
Copied from [place] [id number]. Topic: General - Other >> Apr 18, 2020 10:40 AM Leward Quan A wrote: Reason for CRM: Patient called in to inquire about his visit for 04/19/20 for the covid vaccine. He is asking for a call back because he need to know if this will be billed as a regular office visit. Please call him at Ph# [phone]

## 2020-04-19 ENCOUNTER — Ambulatory Visit: Payer: BC Managed Care – PPO | Admitting: Adult Health

## 2020-05-09 ENCOUNTER — Encounter: Payer: Self-pay | Admitting: Family Medicine

## 2020-05-20 DIAGNOSIS — U071 COVID-19: Secondary | ICD-10-CM

## 2020-05-20 HISTORY — DX: COVID-19: U07.1

## 2020-05-23 ENCOUNTER — Other Ambulatory Visit: Payer: Self-pay

## 2020-05-23 ENCOUNTER — Other Ambulatory Visit: Payer: BC Managed Care – PPO

## 2020-05-23 DIAGNOSIS — Z20822 Contact with and (suspected) exposure to covid-19: Secondary | ICD-10-CM

## 2020-05-29 LAB — NOVEL CORONAVIRUS, NAA: SARS-CoV-2, NAA: DETECTED — AB

## 2020-05-29 LAB — SARS-COV-2, NAA 2 DAY TAT

## 2020-06-06 ENCOUNTER — Encounter: Payer: Self-pay | Admitting: Family Medicine

## 2020-06-19 ENCOUNTER — Encounter: Payer: Self-pay | Admitting: Family Medicine

## 2020-06-19 ENCOUNTER — Other Ambulatory Visit: Payer: Self-pay

## 2020-06-19 ENCOUNTER — Ambulatory Visit (INDEPENDENT_AMBULATORY_CARE_PROVIDER_SITE_OTHER): Payer: BC Managed Care – PPO | Admitting: Family Medicine

## 2020-06-19 VITALS — BP 137/93 | HR 93 | Temp 96.9°F | Resp 16 | Wt 295.0 lb

## 2020-06-19 DIAGNOSIS — Z125 Encounter for screening for malignant neoplasm of prostate: Secondary | ICD-10-CM

## 2020-06-19 DIAGNOSIS — Z1159 Encounter for screening for other viral diseases: Secondary | ICD-10-CM | POA: Diagnosis not present

## 2020-06-19 DIAGNOSIS — Z23 Encounter for immunization: Secondary | ICD-10-CM

## 2020-06-19 DIAGNOSIS — G629 Polyneuropathy, unspecified: Secondary | ICD-10-CM

## 2020-06-19 DIAGNOSIS — E781 Pure hyperglyceridemia: Secondary | ICD-10-CM

## 2020-06-19 DIAGNOSIS — D171 Benign lipomatous neoplasm of skin and subcutaneous tissue of trunk: Secondary | ICD-10-CM

## 2020-06-19 DIAGNOSIS — E559 Vitamin D deficiency, unspecified: Secondary | ICD-10-CM

## 2020-06-19 DIAGNOSIS — Z0000 Encounter for general adult medical examination without abnormal findings: Secondary | ICD-10-CM

## 2020-06-19 NOTE — Progress Notes (Signed)
Complete physical exam   Patient: Travis Abbott   DOB: 1970/08/30   50 y.o. Male  MRN: [id number] Visit Date: 06/19/2020  Today's healthcare provider: Lelon Huh, MD   Chief Complaint  Patient presents with  . Annual Exam  . Hypertension   Subjective    Travis Abbott is a 50 y.o. male who presents today for a complete physical exam.  He reports consuming a general diet. The patient does not participate in regular exercise at present. He generally feels fairly well. He reports sleeping fairly well. He does have additional problems to discuss today.  HPI  Hypertension, follow-up  BP Readings from Last 3 Encounters:  06/19/20 (!) 137/93  10/22/19 134/87  10/12/19 114/86   Wt Readings from Last 3 Encounters:  06/19/20 295 lb (133.8 kg)  10/22/19 292 lb (132.5 kg)  10/12/19 295 lb (133.8 kg)     He was last seen for hypertension 7 months ago.  BP at that visit was 134/87. Management since that visit includes continue same medications.  He reports good compliance with treatment. He is not having side effects.  He is following a Regular diet. He is not exercising. He does not smoke.  Use of agents associated with hypertension: NSAIDS.   Outside blood pressures are not checked. Symptoms: No chest pain No chest pressure  No palpitations No syncope  No dyspnea No orthopnea  No paroxysmal nocturnal dyspnea No lower extremity edema   Pertinent labs: Lab Results  Component Value Date   CHOL 154 05/03/2019   HDL 34 (L) 05/03/2019   LDLCALC 86 05/03/2019   TRIG 200 (H) 05/03/2019   CHOLHDL 4.5 05/03/2019   Lab Results  Component Value Date   NA 140 05/03/2019   K 4.6 05/03/2019   CREATININE 1.20 05/03/2019   GFRNONAA 70 05/03/2019   GFRAA 81 05/03/2019   GLUCOSE 91 05/03/2019     The 10-year ASCVD risk score Mikey Bussing DC Jr., et al., 2013) is: 5.2%    ---------------------------------------------------------------------------------------------------  Also having swelling of left lower chest since having cyst excised by Dr. Nehemiah Massed a few years ago.  He also reports he continues to have numbness and tingling of both feet worse at night for the last few years.   Past Medical History:  Diagnosis Date  . Abscess of scrotum 05/24/2013   Past Surgical History:  Procedure Laterality Date  . ABSCESS DRAINAGE  2013  . APPENDECTOMY  12/07/13   Dr. Bary Castilla, [facility]  . Biopsy of left parotid mass  2008   Benign  . CATARACT EXTRACTION     Left eye: 03/02/2001.  Right eye: 04/07/2000   Social History   Socioeconomic History  . Marital status: Single    Spouse name: Not on file  . Number of children: 1  . Years of education: Not on file  . Highest education level: Not on file  Occupational History  . Occupation: Welder  Tobacco Use  . Smoking status: Former Smoker    Packs/day: 1.00    Years: 10.00    Pack years: 10.00    Types: Cigarettes    Quit date: 05/21/2007    Years since quitting: 13.0  . Smokeless tobacco: Former Network engineer and Sexual Activity  . Alcohol use: Yes    Alcohol/week: 0.0 standard drinks    Comment: drinks 2 drinks per month  . Drug use: No  . Sexual activity: Not on file  Other Topics Concern  . Not on  file  Social History Narrative  . Not on file   Social Determinants of Health   Financial Resource Strain: Not on file  Food Insecurity: Not on file  Transportation Needs: Not on file  Physical Activity: Not on file  Stress: Not on file  Social Connections: Not on file  Intimate Partner Violence: Not on file   Family Status  Relation Name Status  . Mother  Alive  . Father  Alive  . Brother  Alive  . Son  Alive  . MGM  Deceased  . MGF  Deceased  . PGM  Deceased       died from complication of Dementia  . PGF  Deceased  . Mat Uncle  Deceased at age late 56s       colon cancer   Family  History  Problem Relation Age of Onset  . Hypertension Mother   . Skin cancer Father   . Stomach cancer Maternal Grandmother   . Heart attack Maternal Grandfather   . Dementia Paternal Grandmother   . Kidney failure Paternal Grandfather    Allergies  Allergen Reactions  . Losartan Potassium     headaches    Patient Care Team: Birdie Sons, MD as PCP - General (Family Medicine) Bary Castilla, Forest Gleason, MD (General Surgery) Ralene Bathe, MD (Dermatology)   Medications: Outpatient Medications Prior to Visit  Medication Sig  . benzoyl peroxide-erythromycin [facility]) gel Apply topically 2 (two) times daily.  . candesartan (ATACAND) 16 MG tablet Take 1 tablet (16 mg total) by mouth daily.  . Cholecalciferol (VITAMIN D3) 5000 units TABS Take 4,000 Units by mouth daily.   Noelle Penner ASPIRIN LOW DOSE PO Take 1 tablet by mouth.  . fluticasone (FLONASE) 50 MCG/ACT nasal spray Place 2 sprays into both nostrils daily. Reported on 09/18/2015  . Imiquimod 3.75 % CREA Apply to affected areas 3-7 days a week as tolerated until clear if patient notices new spots.  . indomethacin (INDOCIN) 50 MG capsule TAKE ONE CAPSULE BY MOUTH 3 TIMES A DAY AS NEEDED FOR PAIN  . loratadine (CLARITIN) 10 MG tablet Take 10 mg by mouth daily.  . meloxicam (MOBIC) 15 MG tablet TAKE 1 TABLET BY MOUTH EVERY DAY AS NEEDED FOR PAIN  . podofilox (CONDYLOX) 0.5 % gel Apply topically at bedtime.  . psyllium (REGULOID) 0.52 G capsule Take 2 capsules by mouth daily.  . sildenafil (VIAGRA) 100 MG tablet Take 0.5-1 tablets (50-100 mg total) by mouth daily as needed for erectile dysfunction.  . valACYclovir (VALTREX) 1000 MG tablet Take 1 tablet (1,000 mg total) by mouth 2 (two) times daily.   No facility-administered medications prior to visit.    Review of Systems  Constitutional: Negative for appetite change, chills, fatigue and fever.  HENT: Negative for congestion, ear pain, hearing loss, nosebleeds and trouble  swallowing.   Eyes: Negative for pain and visual disturbance.  Respiratory: Positive for cough. Negative for chest tightness and shortness of breath.   Cardiovascular: Negative for chest pain, palpitations and leg swelling.  Gastrointestinal: Negative for abdominal pain, blood in stool, constipation, diarrhea, nausea and vomiting.  Endocrine: Negative for polydipsia, polyphagia and polyuria.  Genitourinary: Negative for dysuria and flank pain.  Musculoskeletal: Negative for arthralgias, back pain, joint swelling, myalgias and neck stiffness.       Asymmetry of left side chest wall  Skin: Negative for color change, rash and wound.  Neurological: Negative for dizziness, tremors, seizures, speech difficulty, weakness, light-headedness and headaches.  Psychiatric/Behavioral:  Negative for behavioral problems, confusion, decreased concentration, dysphoric mood and sleep disturbance. The patient is not nervous/anxious.   All other systems reviewed and are negative.     Objective    BP (!) 137/93 (BP Location: Left Arm, Patient Position: Sitting, Cuff Size: Large)   Pulse 93   Temp (!) 96.9 F (36.1 C) (Temporal)   Resp 16   Wt 295 lb (133.8 kg)   BMI 41.14 kg/m    Physical Exam   General Appearance:    Severely obese male. Alert, cooperative, in no acute distress, appears stated age  Head:    Normocephalic, without obvious abnormality, atraumatic  Eyes:    PERRL, conjunctiva/corneas clear, EOM's intact, fundi    benign, both eyes       Ears:    Normal TM's and external ear canals, both ears  Nose:   Nares normal, septum midline, mucosa normal, no drainage   or sinus tenderness  Throat:   Lips, mucosa, and tongue normal; teeth and gums normal  Neck:   Supple, symmetrical, trachea midline, no adenopathy;       thyroid:  No enlargement/tenderness/nodules; no carotid   bruit or JVD  Back:     Symmetric, no curvature, ROM normal, no CVA tenderness  Lungs:     Clear to auscultation  bilaterally, respirations unlabored  Chest wall:    No tenderness or deformity. Area of fatty like swelling around otherwise well healed site of previous cyst excision. Not tender. No erythema.   Heart:    Normal heart rate. Normal rhythm. No murmurs, rubs, or gallops.  S1 and S2 normal  Abdomen:     Soft, non-tender, bowel sounds active all four quadrants,    no masses, no organomegaly  Genitalia:    deferred  Rectal:    deferred  Extremities:   All extremities are intact. No cyanosis or edema  Pulses:   2+ and symmetric all extremities  Skin:   Skin color, texture, turgor normal, no rashes or lesions  Lymph nodes:   Cervical, supraclavicular, and axillary nodes normal  Neurologic:   CNII-XII intact. Normal strength, sensation and reflexes      throughout     Last depression screening scores PHQ 2/9 Scores 06/19/2020 05/03/2019 04/21/2018  PHQ - 2 Score 1 0 0  PHQ- 9 Score 5 3 3    Last fall risk screening Fall Risk  05/03/2019  Falls in the past year? 0  Number falls in past yr: 0  Injury with Fall? 0   Last Audit-C alcohol use screening Alcohol Use Disorder Test (AUDIT) 05/03/2019  1. How often do you have a drink containing alcohol? 1  2. How many drinks containing alcohol do you have on a typical day when you are drinking? 1  3. How often do you have six or more drinks on one occasion? 0  AUDIT-C Score 2   A score of 3 or more in women, and 4 or more in men indicates increased risk for alcohol abuse, EXCEPT if all of the points are from question 1   No results found for any visits on 06/19/20.  Assessment & Plan    Routine Health Maintenance and Physical Exam  Exercise Activities and Dietary recommendations Goals   None     Immunization History  Administered Date(s) Administered  . Influenza,inj,Quad PF,6+ Mos 04/12/2015  . Tdap 03/28/2009, 05/03/2019  . Zoster Recombinat (Shingrix) 05/03/2019, 10/22/2019    Health Maintenance  Topic Date Due  . Hepatitis C  Screening  Never done  . COVID-19 Vaccine (1) Never done  . HIV Screening  Never done  . INFLUENZA VACCINE  12/19/2019  . COLONOSCOPY (Pts 45-52yrs Insurance coverage will need to be confirmed)  09/17/2021  . TETANUS/TDAP  05/02/2029    Discussed health benefits of physical activity, and encouraged him to engage in regular exercise appropriate for his age and condition.  1. Annual physical exam  - CBC - Comprehensive metabolic panel - Lipid panel - TSH  2. Morbid obesity ([place]) Consider pharmaceutical intervention of unable to lose weight through diet and exercise.   3. Need for hepatitis C screening test  - Hepatitis C antibody  4. Neuropathy Multifactorial. Does have history vitamin d deficiency but not really improved since being on vitamin d replacement.  - Vitamin B12  5. Vitamin D deficiency  - VITAMIN D 25 Hydroxy (Vit-D Deficiency, Fractures)  6. Lipoma of torso Secondary to trauma from prior excision of cyst from chest wall. Reassurance.   7. Hypertriglyceridemia  - CBC - Comprehensive metabolic panel - Lipid panel - TSH  8. Prostate cancer screening  - PSA Total (Reflex To Free) ([facility] only)  9. Need for influenza vaccination  - Flu Vaccine QUAD 36+ mos IM (Fluarix/Fluzone)   He reports he did  Have J&J Covid vaccine and is recovered from mild covid infection earlier this month.      The entirety of the information documented in the History of Present Illness, Review of Systems and Physical Exam were personally obtained by me. Portions of this information were initially documented by the CMA and reviewed by me for thoroughness and accuracy.      Lelon Huh, MD  [facility] [phone] (phone) [phone] (fax)  [place]

## 2020-06-19 NOTE — Patient Instructions (Addendum)
.   Please review the attached list of medications and notify my office if there are any errors.   . Try OTC alpha-lipoic acid 600 three a day to see if it helps with neuropathy. If it's not helping after a month you can stop taking it.

## 2020-06-20 LAB — CBC
Hematocrit: 52.3 % — ABNORMAL HIGH (ref 37.5–51.0)
Hemoglobin: 17.9 g/dL — ABNORMAL HIGH (ref 13.0–17.7)
MCH: 30.8 pg (ref 26.6–33.0)
MCHC: 34.2 g/dL (ref 31.5–35.7)
MCV: 90 fL (ref 79–97)
Platelets: 214 10*3/uL (ref 150–450)
RBC: 5.81 x10E6/uL — ABNORMAL HIGH (ref 4.14–5.80)
RDW: 12.2 % (ref 11.6–15.4)
WBC: 9.9 10*3/uL (ref 3.4–10.8)

## 2020-06-20 LAB — LIPID PANEL
Chol/HDL Ratio: 4.9 ratio (ref 0.0–5.0)
Cholesterol, Total: 187 mg/dL (ref 100–199)
HDL: 38 mg/dL — ABNORMAL LOW (ref >39)
LDL Chol Calc (NIH): 127 mg/dL — ABNORMAL HIGH (ref 0–99)
Triglycerides: 120 mg/dL (ref 0–149)
VLDL Cholesterol Cal: 22 mg/dL (ref 5–40)

## 2020-06-20 LAB — COMPREHENSIVE METABOLIC PANEL
ALT: 61 IU/L — ABNORMAL HIGH (ref 0–44)
AST: 46 IU/L — ABNORMAL HIGH (ref 0–40)
Albumin/Globulin Ratio: 1.4 (ref 1.2–2.2)
Albumin: 4.6 g/dL (ref 3.8–4.9)
Alkaline Phosphatase: 114 IU/L (ref 44–121)
BUN/Creatinine Ratio: 11 (ref 9–20)
BUN: 14 mg/dL (ref 6–24)
Bilirubin Total: 0.7 mg/dL (ref 0.0–1.2)
CO2: 23 mmol/L (ref 20–29)
Calcium: 10.1 mg/dL (ref 8.7–10.2)
Chloride: 100 mmol/L (ref 96–106)
Creatinine, Ser: 1.24 mg/dL (ref 0.76–1.27)
GFR calc Af Amer: 77 mL/min/{1.73_m2} (ref >59)
GFR calc non Af Amer: 67 mL/min/{1.73_m2} (ref >59)
Globulin, Total: 3.3 g/dL (ref 1.5–4.5)
Glucose: 112 mg/dL — ABNORMAL HIGH (ref 65–99)
Potassium: 5.3 mmol/L — ABNORMAL HIGH (ref 3.5–5.2)
Sodium: 139 mmol/L (ref 134–144)
Total Protein: 7.9 g/dL (ref 6.0–8.5)

## 2020-06-20 LAB — TSH: TSH: 1.35 u[IU]/mL (ref 0.450–4.500)

## 2020-06-20 LAB — HEPATITIS C ANTIBODY: Hep C Virus Ab: 0.1 s/co ratio (ref 0.0–0.9)

## 2020-06-20 LAB — VITAMIN B12: Vitamin B-12: 588 pg/mL (ref 232–1245)

## 2020-06-20 LAB — PSA TOTAL (REFLEX TO FREE): Prostate Specific Ag, Serum: 0.5 ng/mL (ref 0.0–4.0)

## 2020-06-20 LAB — VITAMIN D 25 HYDROXY (VIT D DEFICIENCY, FRACTURES): Vit D, 25-Hydroxy: 41.8 ng/mL (ref 30.0–100.0)

## 2020-07-04 ENCOUNTER — Other Ambulatory Visit: Payer: Self-pay | Admitting: Family Medicine

## 2020-07-04 MED ORDER — INDOMETHACIN 50 MG PO CAPS: 50.0000 mg | ORAL_CAPSULE | Freq: Three times a day (TID) | ORAL | 5 refills | Status: DC | PRN

## 2020-07-04 NOTE — Telephone Encounter (Signed)
Medication Refill - Medication:  indomethacin (INDOCIN) 50 MG capsule 30 capsule 5 07/31/2018    Sig: TAKE ONE CAPSULE BY MOUTH 3 TIMES A DAY AS NEEDED FOR PAIN      Has the patient contacted their pharmacy?yes (Agent: If no, request that the patient contact the pharmacy for the refill.) (Agent: If yes, when and what did the pharmacy advise?)  Preferred Pharmacy (with phone number or street name): [place], [place] Phone:  [phone]  Fax:  [phone]     Pt is asking that this med be sent to [facility] today as a convenience vs [facility]. Pt has no refills and had a CPE with Dr Caryn Section in past month. Flare up Right Knee (gout) Agent: Please be advised that RX refills may take up to 3 business days. We ask that you follow-up with your pharmacy.

## 2020-07-04 NOTE — Telephone Encounter (Signed)
   Notes to clinic: script has expired  Review for refill    Requested Prescriptions  Pending Prescriptions Disp Refills   indomethacin (INDOCIN) 50 MG capsule 30 capsule 5      Analgesics:  NSAIDS Failed - 07/04/2020  9:02 AM      Failed - HGB in normal range and within 360 days    Hemoglobin  Date Value Ref Range Status  06/19/2020 17.9 (H) 13.0 - 17.7 g/dL Final          Passed - Cr in normal range and within 360 days    Creatinine  Date Value Ref Range Status  12/07/2013 1.28 0.60 - 1.30 mg/dL Final   Creatinine, Ser  Date Value Ref Range Status  06/19/2020 1.24 0.76 - 1.27 mg/dL Final          Passed - Patient is not pregnant      Passed - Valid encounter within last 12 months    Recent Outpatient Visits           2 weeks ago Annual physical exam   [facility] Birdie Sons, MD   8 months ago Essential (primary) hypertension   [place], Kirstie Peri, MD   8 months ago Herpes labialis   [facility], Vickki Muff, PA-C   1 year ago Epidermal cyst   [facility] [place], Clearnce Sorrel, [place]   1 year ago Annual physical exam   [facility] Birdie Sons, MD

## 2020-07-25 ENCOUNTER — Ambulatory Visit (INDEPENDENT_AMBULATORY_CARE_PROVIDER_SITE_OTHER): Payer: BC Managed Care – PPO | Admitting: Family Medicine

## 2020-07-25 ENCOUNTER — Other Ambulatory Visit: Payer: Self-pay

## 2020-07-25 ENCOUNTER — Encounter: Payer: Self-pay | Admitting: Family Medicine

## 2020-07-25 VITALS — BP 144/87 | HR 79 | Temp 97.0°F | Resp 16 | Wt 288.0 lb

## 2020-07-25 DIAGNOSIS — R7401 Elevation of levels of liver transaminase levels: Secondary | ICD-10-CM | POA: Diagnosis not present

## 2020-07-25 DIAGNOSIS — S90111A Contusion of right great toe without damage to nail, initial encounter: Secondary | ICD-10-CM | POA: Diagnosis not present

## 2020-07-25 NOTE — Progress Notes (Signed)
Established patient visit   Patient: Travis Abbott   DOB: 06/04/1970   50 y.o. Male  MRN: [id number] Visit Date: 07/25/2020  Today's healthcare provider: Lelon Huh, MD   Chief Complaint  Patient presents with  . Nail Problem   Subjective    HPI  Toenail problem: Patient is here for an evaluation of discoloration of the great toe of the right foot. He states "my toe nail is black". He first noticed the color change 2 weeks ago. He denies any injury or trauma to the toe. Associated symptoms includes redness around the great toe. He denies any pain or swelling.  He does have history of neuropathy so does not have very good feeling in the toe. He states he did get some new steel toed boots recently.       Medications: Outpatient Medications Prior to Visit  Medication Sig  . benzoyl peroxide-erythromycin (BENZAMYCIN) gel Apply topically 2 (two) times daily.  . candesartan (ATACAND) 16 MG tablet Take 1 tablet (16 mg total) by mouth daily.  . Cholecalciferol (VITAMIN D3) 5000 units TABS Take 4,000 Units by mouth daily.   Noelle Penner ASPIRIN LOW DOSE PO Take 1 tablet by mouth.  . fluticasone (FLONASE) 50 MCG/ACT nasal spray Place 2 sprays into both nostrils daily. Reported on 09/18/2015  . Imiquimod 3.75 % CREA Apply to affected areas 3-7 days a week as tolerated until clear if patient notices new spots.  . indomethacin (INDOCIN) 50 MG capsule Take 1 capsule (50 mg total) by mouth 3 (three) times daily as needed.  . loratadine (CLARITIN) 10 MG tablet Take 10 mg by mouth daily.  . meloxicam (MOBIC) 15 MG tablet TAKE 1 TABLET BY MOUTH EVERY DAY AS NEEDED FOR PAIN  . podofilox (CONDYLOX) 0.5 % gel Apply topically at bedtime.  . psyllium (REGULOID) 0.52 G capsule Take 2 capsules by mouth daily.  . sildenafil (VIAGRA) 100 MG tablet Take 0.5-1 tablets (50-100 mg total) by mouth daily as needed for erectile dysfunction.  . valACYclovir (VALTREX) 1000 MG tablet Take 1 tablet (1,000 mg total)  by mouth 2 (two) times daily.   No facility-administered medications prior to visit.    Review of Systems  Constitutional: Negative for appetite change, chills and fever.  Respiratory: Negative for chest tightness, shortness of breath and wheezing.   Cardiovascular: Negative for chest pain and palpitations.  Gastrointestinal: Negative for abdominal pain, nausea and vomiting.  Skin: Positive for color change.       Objective    BP (!) 144/87 (BP Location: Right Arm, Patient Position: Sitting, Cuff Size: Large)   Pulse 79   Temp (!) 97 F (36.1 C) (Temporal)   Resp 16   Wt 288 lb (130.6 kg)   BMI 40.17 kg/m     Physical Exam   violaceous discoloration across proximal right great toenails sparing only the distal 39mm of nail. No erythema, swelling or discharge of surrounding skin tissue,     Assessment & Plan     1. Contusion of right great toe without damage to nail, initial encounter May be related to recent change in steel toes boots, no other known trauma, but sensation is limited due to neuropathy. Expect this to resolve over the next several weeks will recheck at follow up in a month.   2. Elevated transaminase level Noted on routine labs in December, will check at follow up in April       The entirety of the information  documented in the History of Present Illness, Review of Systems and Physical Exam were personally obtained by me. Portions of this information were initially documented by the CMA and reviewed by me for thoroughness and accuracy.      Lelon Huh, MD  [facility] [phone] (phone) [phone] (fax)  [place]

## 2020-08-30 ENCOUNTER — Other Ambulatory Visit: Payer: Self-pay

## 2020-08-30 ENCOUNTER — Ambulatory Visit (INDEPENDENT_AMBULATORY_CARE_PROVIDER_SITE_OTHER): Payer: BC Managed Care – PPO | Admitting: Family Medicine

## 2020-08-30 VITALS — BP 143/90 | HR 72 | Ht 72.0 in | Wt 278.6 lb

## 2020-08-30 DIAGNOSIS — S90111A Contusion of right great toe without damage to nail, initial encounter: Secondary | ICD-10-CM

## 2020-08-30 DIAGNOSIS — R7401 Elevation of levels of liver transaminase levels: Secondary | ICD-10-CM

## 2020-08-30 DIAGNOSIS — B351 Tinea unguium: Secondary | ICD-10-CM | POA: Diagnosis not present

## 2020-08-30 NOTE — Patient Instructions (Addendum)
. Please review the attached list of medications and notify my office if there are any errors.   . Please bring all of your medications to every appointment so we can make sure that our medication list is the same as yours.    Fungal Nail Infection A fungal nail infection is a common infection of the toenails or fingernails. This condition affects toenails more often than fingernails. It often affects the great, or big, toes. More than one nail may be infected. The condition can be passed from person to person (is contagious). What are the causes? This condition is caused by a fungus. Several types of fungi can cause the infection. These fungi are common in moist and warm areas. If your hands or feet come into contact with the fungus, it may get into a crack in your fingernail or toenail and cause the infection. What increases the risk? The following factors may make you more likely to develop this condition:  Being male.  Being of older age.  Living with someone who has the fungus.  Walking barefoot in areas where the fungus thrives, such as showers or locker rooms.  Wearing shoes and socks that cause your feet to sweat.  Having a nail injury or a recent nail surgery.  Having certain medical conditions, such as: ? Athlete's foot. ? Diabetes. ? Psoriasis. ? Poor circulation. ? A weak body defense system (immune system). What are the signs or symptoms? Symptoms of this condition include:  A pale spot on the nail.  Thickening of the nail.  A nail that becomes yellow or brown.  A brittle or ragged nail edge.  A crumbling nail.  A nail that has lifted away from the nail bed.   How is this diagnosed? This condition is diagnosed with a physical exam. Your health care provider may take a scraping or clipping from your nail to test for the fungus. How is this treated? Treatment is not needed for mild infections. If you have significant nail changes, treatment may  include:  Antifungal medicines taken by mouth (orally). You may need to take the medicine for several weeks or several months, and you may not see the results for a long time. These medicines can cause side effects. Ask your health care provider what problems to watch for.  Antifungal nail polish or nail cream. These may be used along with oral antifungal medicines.  Laser treatment of the nail.  Surgery to remove the nail. This may be needed for the most severe infections. It can take a long time, usually up to a year, for the infection to go away. The infection may also come back.   Follow these instructions at home: Medicines  Take or apply over-the-counter and prescription medicines only as told by your health care provider.  Ask your health care provider about using over-the-counter mentholated ointment on your nails. Nail care  Trim your nails often.  Wash and dry your hands and feet every day.  Keep your feet dry: ? Wear absorbent socks, and change your socks frequently. ? Wear shoes that allow air to circulate, such as sandals or canvas tennis shoes. Throw out old shoes.  Do not use artificial nails.  If you go to a nail salon, make sure you choose one that uses clean instruments.  Use antifungal foot powder on your feet and in your shoes. General instructions  Do not share personal items, such as towels or nail clippers.  Do not walk barefoot in shower  rooms or locker rooms.  Wear rubber gloves if you are working with your hands in wet areas.  Keep all follow-up visits as told by your health care provider. This is important. Contact a health care provider if: Your infection is not getting better or it is getting worse after several months. Summary  A fungal nail infection is a common infection of the toenails or fingernails.  Treatment is not needed for mild infections. If you have significant nail changes, treatment may include taking medicine orally and  applying medicine to your nails.  It can take a long time, usually up to a year, for the infection to go away. The infection may also come back.  Take or apply over-the-counter and prescription medicines only as told by your health care provider.  Follow instructions for taking care of your nails to help prevent infection from coming back or spreading. This information is not intended to replace advice given to you by your health care provider. Make sure you discuss any questions you have with your health care provider. Document Revised: 08/27/2018 Document Reviewed: 10/10/2017 Elsevier Patient Education  2021 [employer].

## 2020-08-30 NOTE — Progress Notes (Signed)
Established patient visit   Patient: Travis Abbott   DOB: February 13, 1971   50 y.o. Male  MRN: [id number] Visit Date: 08/30/2020  Today's healthcare provider: Lelon Huh, MD   No chief complaint on file.  Subjective    HPI  Follow up for Contusion of right great toe without damage to nail:  The patient was last seen for this on 07/25/2020 Changes made at last visit include none; will recheck in 1 month.  He reports n/a compliance with treatment. He feels that condition is Unchanged. It is not painful.  He is not having side effects. N/a  -----------------------------------------------------------------------------------------  Follow up for Elevated transaminase level:  The patient was last seen for this 07/25/2020.   Changes made at last visit include none; will recheck levels in 1 month.  -----------------------------------------------------------------------------------------      Medications: Outpatient Medications Prior to Visit  Medication Sig  . benzoyl peroxide-erythromycin (BENZAMYCIN) gel Apply topically 2 (two) times daily.  . candesartan (ATACAND) 16 MG tablet Take 1 tablet (16 mg total) by mouth daily.  . Cholecalciferol (VITAMIN D3) 5000 units TABS Take 4,000 Units by mouth daily.   Noelle Penner ASPIRIN LOW DOSE PO Take 1 tablet by mouth.  . fluticasone (FLONASE) 50 MCG/ACT nasal spray Place 2 sprays into both nostrils daily. Reported on 09/18/2015  . Imiquimod 3.75 % CREA Apply to affected areas 3-7 days a week as tolerated until clear if patient notices new spots.  . indomethacin (INDOCIN) 50 MG capsule Take 1 capsule (50 mg total) by mouth 3 (three) times daily as needed.  . loratadine (CLARITIN) 10 MG tablet Take 10 mg by mouth daily.  . meloxicam (MOBIC) 15 MG tablet TAKE 1 TABLET BY MOUTH EVERY DAY AS NEEDED FOR PAIN  . podofilox (CONDYLOX) 0.5 % gel Apply topically at bedtime.  . psyllium (REGULOID) 0.52 G capsule Take 2 capsules by mouth daily.  .  sildenafil (VIAGRA) 100 MG tablet Take 0.5-1 tablets (50-100 mg total) by mouth daily as needed for erectile dysfunction.  . valACYclovir (VALTREX) 1000 MG tablet Take 1 tablet (1,000 mg total) by mouth 2 (two) times daily.   No facility-administered medications prior to visit.    Review of Systems    Objective    BP (!) 143/90 (BP Location: Left Arm, Patient Position: Sitting, Cuff Size: Large)   Pulse 72   Ht 6' (1.829 m)   Wt 278 lb 9.6 oz (126.4 kg)   SpO2 99%   BMI 37.78 kg/m  Wt Readings from Last 3 Encounters:  08/30/20 278 lb 9.6 oz (126.4 kg)  07/25/20 288 lb (130.6 kg)  06/19/20 295 lb (133.8 kg)       Physical Exam  Purple discoloration of great toenail with thickened dysmorphic nail.     Assessment & Plan     1. Elevated transaminase level Likely fatty liver, has made dramatic improvements to his diet and noted to have lost 10 pounds since last month.  - Hepatic function panel - Hepatitis B Core Antibody, total  2. Onychomycosis Likely cause of persistent contusion of great toenail. If transaminases are back to normal will treat with Lamisil  3. Contusion of right great toe without damage to nail, initial encounter        The entirety of the information documented in the History of Present Illness, Review of Systems and Physical Exam were personally obtained by me. Portions of this information were initially documented by the [place] and reviewed by  me for thoroughness and accuracy.  Lelon Huh, MD  [facility] [phone] (phone) [phone] (fax)  [place]

## 2020-08-31 ENCOUNTER — Telehealth: Payer: Self-pay | Admitting: Family Medicine

## 2020-08-31 ENCOUNTER — Other Ambulatory Visit: Payer: Self-pay | Admitting: Family Medicine

## 2020-08-31 DIAGNOSIS — B351 Tinea unguium: Secondary | ICD-10-CM

## 2020-08-31 LAB — HEPATITIS B CORE ANTIBODY, TOTAL: Hep B Core Total Ab: NEGATIVE

## 2020-08-31 LAB — HEPATIC FUNCTION PANEL
ALT: 27 IU/L (ref 0–44)
AST: 23 IU/L (ref 0–40)
Albumin: 4.6 g/dL (ref 3.8–4.9)
Alkaline Phosphatase: 114 IU/L (ref 44–121)
Bilirubin Total: 1.1 mg/dL (ref 0.0–1.2)
Bilirubin, Direct: 0.27 mg/dL (ref 0.00–0.40)
Total Protein: 7.4 g/dL (ref 6.0–8.5)

## 2020-08-31 MED ORDER — TERBINAFINE HCL 250 MG PO TABS: 500.0000 mg | ORAL_TABLET | Freq: Every day | ORAL | 0 refills | Status: DC

## 2020-08-31 MED ORDER — TERBINAFINE HCL 250 MG PO TABS: 250.0000 mg | ORAL_TABLET | Freq: Every day | ORAL | 0 refills | Status: DC

## 2020-08-31 NOTE — Telephone Encounter (Signed)
Christy pharmacist with [facility] is calling and callback number [phone] she would like to verify terbinafine 250 mg is twice a day instead of once a day

## 2020-08-31 NOTE — Telephone Encounter (Signed)
Please call pharmacy back and advise Terbinafine 250 mg once daily.

## 2020-08-31 NOTE — Telephone Encounter (Signed)
It is supposed to be one a day. Have resent prescription

## 2020-09-25 ENCOUNTER — Telehealth: Payer: Self-pay | Admitting: Family Medicine

## 2020-09-25 DIAGNOSIS — B351 Tinea unguium: Secondary | ICD-10-CM

## 2020-09-25 NOTE — Telephone Encounter (Signed)
Please advise patient is time to check liver functions since starting Lamisil last month. Please print order and leave for him a lab.

## 2020-09-26 NOTE — Telephone Encounter (Signed)
I called patient and patient verbalized understanding of information below. Patient said his toenail eventually fell off so he stopped taking the Lamisil but he still wants to get lab work done.

## 2020-09-28 ENCOUNTER — Other Ambulatory Visit: Payer: Self-pay | Admitting: Family Medicine

## 2020-09-28 DIAGNOSIS — B351 Tinea unguium: Secondary | ICD-10-CM

## 2020-09-28 LAB — HEPATIC FUNCTION PANEL
ALT: 19 IU/L (ref 0–44)
AST: 25 IU/L (ref 0–40)
Albumin: 4.5 g/dL (ref 3.8–4.9)
Alkaline Phosphatase: 107 IU/L (ref 44–121)
Bilirubin Total: 0.4 mg/dL (ref 0.0–1.2)
Bilirubin, Direct: 0.11 mg/dL (ref 0.00–0.40)
Total Protein: 7.6 g/dL (ref 6.0–8.5)

## 2020-09-28 MED ORDER — TERBINAFINE HCL 250 MG PO TABS: 250.0000 mg | ORAL_TABLET | Freq: Every day | ORAL | 4 refills | Status: DC

## 2020-10-02 ENCOUNTER — Other Ambulatory Visit: Payer: Self-pay | Admitting: Family Medicine

## 2020-10-02 DIAGNOSIS — L709 Acne, unspecified: Secondary | ICD-10-CM

## 2020-10-03 MED ORDER — BENZOYL PEROXIDE-ERYTHROMYCIN 5-3 % EX GEL
Freq: Two times a day (BID) | CUTANEOUS | 3 refills | Status: DC
Start: 2020-10-03 — End: 2021-12-21

## 2020-10-11 ENCOUNTER — Other Ambulatory Visit: Payer: Self-pay | Admitting: Dermatology

## 2020-10-11 MED ORDER — PODOFILOX 0.5 % EX GEL: Freq: Every evening | CUTANEOUS | 0 refills | Status: DC

## 2021-01-26 ENCOUNTER — Other Ambulatory Visit: Payer: Self-pay | Admitting: Family Medicine

## 2021-01-26 DIAGNOSIS — B001 Herpesviral vesicular dermatitis: Secondary | ICD-10-CM

## 2021-03-18 ENCOUNTER — Telehealth: Payer: Self-pay | Admitting: Family Medicine

## 2021-03-18 NOTE — Telephone Encounter (Signed)
Should have enough to last 1/23 last RF 03/01/20; overdue labs

## 2021-03-21 MED ORDER — CANDESARTAN CILEXETIL 16 MG PO TABS: 16.0000 mg | ORAL_TABLET | Freq: Every day | ORAL | 0 refills | Status: DC

## 2021-03-21 NOTE — Telephone Encounter (Signed)
Patient is totally out of Candesartan.  He does not have a refill on it. Please send into [facility] on [address].

## 2021-06-22 ENCOUNTER — Other Ambulatory Visit: Payer: Self-pay | Admitting: Family Medicine

## 2021-06-22 NOTE — Telephone Encounter (Signed)
Requested medications are due for refill today.  yes  Requested medications are on the active medications list.  yes  Last refill. 03/21/2021  Future visit scheduled.   yes  Notes to clinic.  Failed protocol d/t expired labs. Pt is more than 3 months overdue for OV.    Requested Prescriptions  Pending Prescriptions Disp Refills   candesartan (ATACAND) 16 MG tablet [Pharmacy Med Name: Candesartan Cilexetil 16 MG Oral Tablet] 90 tablet 0    Sig: Take 1 tablet by mouth daily     Cardiovascular:  Angiotensin Receptor Blockers - candesartan cilexetil Failed - 06/22/2021  9:25 PM      Failed - K in normal range and within 180 days    Potassium  Date Value Ref Range Status  06/19/2020 5.3 (H) 3.5 - 5.2 mmol/L Final  12/07/2013 4.1 3.5 - 5.1 mmol/L Final          Failed - Cr in normal range and within 180 days    Creatinine  Date Value Ref Range Status  12/07/2013 1.28 0.60 - 1.30 mg/dL Final   Creatinine, Ser  Date Value Ref Range Status  06/19/2020 1.24 0.76 - 1.27 mg/dL Final          Failed - Last BP in normal range    BP Readings from Last 1 Encounters:  08/30/20 (!) 143/90          Failed - Valid encounter within last 6 months    Recent Outpatient Visits           9 months ago Elevated transaminase level   [facility] Birdie Sons, MD   11 months ago Contusion of right great toe without damage to nail, initial encounter   [facility] Birdie Sons, MD   1 year ago Annual physical exam   [facility] Birdie Sons, MD   1 year ago Essential (primary) hypertension   [facility] Birdie Sons, MD   1 year ago Herpes labialis   [place], PA-C       Future Appointments             In 3 months Fisher, Kirstie Peri, MD [facility]            Passed - AST in normal range and within 360 days    AST  Date Value Ref Range Status   09/27/2020 25 0 - 40 IU/L Final   SGOT(AST)  Date Value Ref Range Status  12/07/2013 25 15 - 37 Unit/L Final          Passed - ALT in normal range and within 360 days    ALT  Date Value Ref Range Status  09/27/2020 19 0 - 44 IU/L Final   SGPT (ALT)  Date Value Ref Range Status  12/07/2013 33 12 - 78 U/L Final          Passed - Patient is not pregnant      Signed Prescriptions Disp Refills   sildenafil (VIAGRA) 100 MG tablet 5 tablet 2    Sig: TAKE 1/2 TO 1 (ONE-HALF TO ONE) TABLET BY MOUTH ONCE DAILY AS NEEDED FOR ERECTILE DYSFUNCTION     Urology: Erectile Dysfunction Agents Failed - 06/22/2021  9:25 PM      Failed - Last BP in normal range    BP Readings from Last 1 Encounters:  08/30/20 (!) 143/90  Passed - AST in normal range and within 360 days    AST  Date Value Ref Range Status  09/27/2020 25 0 - 40 IU/L Final   SGOT(AST)  Date Value Ref Range Status  12/07/2013 25 15 - 37 Unit/L Final          Passed - ALT in normal range and within 360 days    ALT  Date Value Ref Range Status  09/27/2020 19 0 - 44 IU/L Final   SGPT (ALT)  Date Value Ref Range Status  12/07/2013 33 12 - 78 U/L Final          Passed - Valid encounter within last 12 months    Recent Outpatient Visits           9 months ago Elevated transaminase level   [facility] Birdie Sons, MD   11 months ago Contusion of right great toe without damage to nail, initial encounter   [facility] Birdie Sons, MD   1 year ago Annual physical exam   [facility] Birdie Sons, MD   1 year ago Essential (primary) hypertension   [facility] Birdie Sons, MD   1 year ago Herpes labialis   [place]egino Ramirez, PA-C       Future Appointments             In 3 months Fisher, Kirstie Peri, MD [facility] [place]

## 2021-06-22 NOTE — Telephone Encounter (Signed)
Requested Prescriptions  Pending Prescriptions Disp Refills   sildenafil (VIAGRA) 100 MG tablet [Pharmacy Med Name: Sildenafil Citrate 100 MG Oral Tablet] 5 tablet 0    Sig: TAKE 1/2 TO 1 (ONE-HALF TO ONE) TABLET BY MOUTH ONCE DAILY AS NEEDED FOR ERECTILE DYSFUNCTION     Urology: Erectile Dysfunction Agents Failed - 06/22/2021  9:25 PM      Failed - Last BP in normal range    BP Readings from Last 1 Encounters:  08/30/20 (!) 143/90         Passed - AST in normal range and within 360 days    AST  Date Value Ref Range Status  09/27/2020 25 0 - 40 IU/L Final   SGOT(AST)  Date Value Ref Range Status  12/07/2013 25 15 - 37 Unit/L Final         Passed - ALT in normal range and within 360 days    ALT  Date Value Ref Range Status  09/27/2020 19 0 - 44 IU/L Final   SGPT (ALT)  Date Value Ref Range Status  12/07/2013 33 12 - 78 U/L Final         Passed - Valid encounter within last 12 months    Recent Outpatient Visits          9 months ago Elevated transaminase level   [facility] Birdie Sons, MD   11 months ago Contusion of right great toe without damage to nail, initial encounter   [facility] Birdie Sons, MD   1 year ago Annual physical exam   [facility] Birdie Sons, MD   1 year ago Essential (primary) hypertension   [facility] Birdie Sons, MD   1 year ago Herpes labialis   [place], PA-C      Future Appointments            In 3 months Fisher, Kirstie Peri, MD [facility]            candesartan (ATACAND) 16 MG tablet [Pharmacy Med Name: Candesartan Cilexetil 16 MG Oral Tablet] 90 tablet 0    Sig: Take 1 tablet by mouth daily     Cardiovascular:  Angiotensin Receptor Blockers - candesartan cilexetil Failed - 06/22/2021  9:25 PM      Failed - K in normal range and within 180 days    Potassium  Date Value Ref Range Status   06/19/2020 5.3 (H) 3.5 - 5.2 mmol/L Final  12/07/2013 4.1 3.5 - 5.1 mmol/L Final         Failed - Cr in normal range and within 180 days    Creatinine  Date Value Ref Range Status  12/07/2013 1.28 0.60 - 1.30 mg/dL Final   Creatinine, Ser  Date Value Ref Range Status  06/19/2020 1.24 0.76 - 1.27 mg/dL Final         Failed - Last BP in normal range    BP Readings from Last 1 Encounters:  08/30/20 (!) 143/90         Failed - Valid encounter within last 6 months    Recent Outpatient Visits          9 months ago Elevated transaminase level   [facility] Birdie Sons, MD   11 months ago Contusion of right great toe without damage to nail, initial encounter   [facility] Birdie Sons, MD   1 year ago  Annual physical exam   [facility] Birdie Sons, MD   1 year ago Essential (primary) hypertension   [place], Kirstie Peri, MD   1 year ago Herpes labialis   [employer], Vickki Muff, PA-C      Future Appointments            In 3 months Fisher, Kirstie Peri, MD [facility]           Passed - AST in normal range and within 360 days    AST  Date Value Ref Range Status  09/27/2020 25 0 - 40 IU/L Final   SGOT(AST)  Date Value Ref Range Status  12/07/2013 25 15 - 37 Unit/L Final         Passed - ALT in normal range and within 360 days    ALT  Date Value Ref Range Status  09/27/2020 19 0 - 44 IU/L Final   SGPT (ALT)  Date Value Ref Range Status  12/07/2013 33 12 - 78 U/L Final         Passed - Patient is not pregnant

## 2021-06-26 ENCOUNTER — Other Ambulatory Visit: Payer: Self-pay | Admitting: Family Medicine

## 2021-06-27 MED ORDER — CANDESARTAN CILEXETIL 16 MG PO TABS: 16.0000 mg | ORAL_TABLET | Freq: Every day | ORAL | 0 refills | Status: DC

## 2021-06-29 ENCOUNTER — Ambulatory Visit (INDEPENDENT_AMBULATORY_CARE_PROVIDER_SITE_OTHER): Payer: BC Managed Care – PPO | Admitting: Family Medicine

## 2021-06-29 ENCOUNTER — Encounter: Payer: Self-pay | Admitting: Family Medicine

## 2021-06-29 ENCOUNTER — Other Ambulatory Visit: Payer: Self-pay

## 2021-06-29 VITALS — BP 166/102 | HR 85 | Temp 98.7°F | Resp 16 | Wt 302.3 lb

## 2021-06-29 DIAGNOSIS — J029 Acute pharyngitis, unspecified: Secondary | ICD-10-CM | POA: Diagnosis not present

## 2021-06-29 DIAGNOSIS — E781 Pure hyperglyceridemia: Secondary | ICD-10-CM

## 2021-06-29 DIAGNOSIS — I10 Essential (primary) hypertension: Secondary | ICD-10-CM

## 2021-06-29 DIAGNOSIS — R739 Hyperglycemia, unspecified: Secondary | ICD-10-CM | POA: Diagnosis not present

## 2021-06-29 LAB — POCT RAPID STREP A (OFFICE): Rapid Strep A Screen: NEGATIVE

## 2021-06-29 MED ORDER — PREDNISONE 10 MG (21) PO TBPK: ORAL_TABLET | ORAL | 0 refills | Status: DC

## 2021-06-29 MED ORDER — HYDROCHLOROTHIAZIDE 25 MG PO TABS: 25.0000 mg | ORAL_TABLET | Freq: Every day | ORAL | 3 refills | Status: DC

## 2021-06-29 NOTE — Assessment & Plan Note (Signed)
Repeat lipids, non fasting

## 2021-06-29 NOTE — Assessment & Plan Note (Signed)
Reassurance provided Will send for cx Rapid negative Add steroids to assist with swollen glands

## 2021-06-29 NOTE — Assessment & Plan Note (Signed)
BMI 41 Discussed importance of healthy weight management Discussed diet and exercise HTN HLD Proteinuria

## 2021-06-29 NOTE — Progress Notes (Signed)
Established patient visit   Patient: Travis Abbott   DOB: 1971/02/18   51 y.o. Male  MRN: [id number] Visit Date: 06/29/2021  Today's healthcare provider: Gwyneth Sprout, FNP   Chief Complaint  Patient presents with   Sore Throat   Subjective    Sore Throat  This is a new problem. The current episode started in the past 7 days. There has been no fever. Pertinent negatives include no abdominal pain, congestion, coughing, diarrhea, drooling, ear discharge, ear pain, headaches, hoarse voice, plugged ear sensation, neck pain, shortness of breath, stridor, swollen glands, trouble swallowing or vomiting. He has had no exposure to strep or mono. He has tried NSAIDs for the symptoms. The treatment provided mild relief.     Medications: Outpatient Medications Prior to Visit  Medication Sig   benzoyl peroxide-erythromycin (BENZAMYCIN) gel Apply topically 2 (two) times daily.   candesartan (ATACAND) 16 MG tablet Take 1 tablet (16 mg total) by mouth daily. Please schedule an office visit before anymore refills.   Cholecalciferol (VITAMIN D3) 5000 units TABS Take 4,000 Units by mouth daily.    EQ ASPIRIN LOW DOSE PO Take 1 tablet by mouth.   fluticasone (FLONASE) 50 MCG/ACT nasal spray Place 2 sprays into both nostrils daily. Reported on 09/18/2015   Imiquimod 3.75 % CREA Apply to affected areas 3-7 days a week as tolerated until clear if patient notices new spots.   indomethacin (INDOCIN) 50 MG capsule Take 1 capsule (50 mg total) by mouth 3 (three) times daily as needed.   loratadine (CLARITIN) 10 MG tablet Take 10 mg by mouth daily.   meloxicam (MOBIC) 15 MG tablet TAKE 1 TABLET BY MOUTH EVERY DAY AS NEEDED FOR PAIN   podofilox (CONDYLOX) 0.5 % gel Apply topically at bedtime.   psyllium (REGULOID) 0.52 G capsule Take 2 capsules by mouth daily.   sildenafil (VIAGRA) 100 MG tablet TAKE 1/2 TO 1 (ONE-HALF TO ONE) TABLET BY MOUTH ONCE DAILY AS NEEDED FOR ERECTILE DYSFUNCTION   terbinafine  (LAMISIL) 250 MG tablet Take 1 tablet (250 mg total) by mouth daily.   valACYclovir (VALTREX) 1000 MG tablet TAKE 1 TABLET BY MOUTH TWICE A DAY   No facility-administered medications prior to visit.    Review of Systems  HENT:  Negative for congestion, drooling, ear discharge, ear pain, hoarse voice and trouble swallowing.   Respiratory:  Negative for cough, shortness of breath and stridor.   Gastrointestinal:  Negative for abdominal pain, diarrhea and vomiting.  Musculoskeletal:  Negative for neck pain.  Neurological:  Negative for headaches.     Objective    BP (!) 166/102    Pulse 85    Temp 98.7 F (37.1 C) (Oral)    Resp 16    Wt (!) 302 lb 4.8 oz (137.1 kg)    SpO2 97%    BMI 41.00 kg/m    Physical Exam Vitals and nursing note reviewed.  Constitutional:      Appearance: Normal appearance. He is obese.  HENT:     Head: Normocephalic and atraumatic.     Right Ear: Tympanic membrane and ear canal normal.     Left Ear: Tympanic membrane and ear canal normal.     Mouth/Throat:     Mouth: Mucous membranes are moist.     Pharynx: Uvula midline. Pharyngeal swelling, oropharyngeal exudate and posterior oropharyngeal erythema present.     Tonsils: No tonsillar exudate or tonsillar abscesses. 0 on the right. 0 on  the left.     Comments: C/o exudate- not seen on exam Eyes:     Pupils: Pupils are equal, round, and reactive to light.  Cardiovascular:     Rate and Rhythm: Normal rate and regular rhythm.     Pulses: Normal pulses.     Heart sounds: Normal heart sounds.  Pulmonary:     Effort: Pulmonary effort is normal.     Breath sounds: Normal breath sounds.  Musculoskeletal:        General: Normal range of motion.     Cervical back: Normal range of motion.  Skin:    General: Skin is warm and dry.     Capillary Refill: Capillary refill takes less than 2 seconds.  Neurological:     General: No focal deficit present.     Mental Status: He is alert and oriented to person,  place, and time. Mental status is at baseline.  Psychiatric:        Mood and Affect: Mood normal.        Behavior: Behavior normal.     Results for orders placed or performed in visit on 06/29/21  POCT rapid strep A  Result Value Ref Range   Rapid Strep A Screen Negative Negative    Assessment & Plan     Problem List Items Addressed This Visit       Cardiovascular and Mediastinum   Essential (primary) hypertension    Chronic, unstable Add HCTZ Denies CP Denies SOB Denies DOE No LE Edema noted on exam Continue medication Refills provided Seek emergent care if you develop CP, chest pain or chest pressure       Relevant Medications   hydrochlorothiazide (HYDRODIURIL) 25 MG tablet   Other Relevant Orders   Comprehensive metabolic panel     Respiratory   Viral pharyngitis - Primary    Reassurance provided Will send for cx Rapid negative Add steroids to assist with swollen glands      Relevant Medications   predniSONE (STERAPRED UNI-PAK 21 TAB) 10 MG (21) TBPK tablet   Other Relevant Orders   POCT rapid strep A (Completed)   Culture, Group A Strep     Other   Elevated serum glucose    Check for a1c elevation; morbid obesity- htn-hld      Relevant Orders   Hemoglobin A1c   Hypertriglyceridemia    Repeat lipids, non fasting      Relevant Medications   hydrochlorothiazide (HYDRODIURIL) 25 MG tablet   Other Relevant Orders   Lipid panel   Morbid obesity (HCC)    BMI 41 Discussed importance of healthy weight management Discussed diet and exercise HTN HLD Proteinuria       Relevant Orders   Hemoglobin A1c     Return if symptoms worsen or fail to improve, for we will f/u on labs .      Vonna Kotyk, FNP, have reviewed all documentation for this visit. The documentation on 06/29/21 for the exam, diagnosis, procedures, and orders are all accurate and complete.  Alfonse Ras, CMA, acting as a Education administrator for Gwyneth Sprout, FNP.,have documented  all relevant documentation on the behalf of Gwyneth Sprout, FNP,as directed by  Gwyneth Sprout, FNP while in the presence of Gwyneth Sprout, FNP.   Gwyneth Sprout, [place] [phone] (phone) [phone] (fax)  [place]

## 2021-06-29 NOTE — Assessment & Plan Note (Signed)
Chronic, unstable Add HCTZ Denies CP Denies SOB Denies DOE No LE Edema noted on exam Continue medication Refills provided Seek emergent care if you develop CP, chest pain or chest pressure

## 2021-06-29 NOTE — Assessment & Plan Note (Signed)
Check for a1c elevation; morbid obesity- htn-hld

## 2021-06-30 LAB — COMPREHENSIVE METABOLIC PANEL
ALT: 29 IU/L (ref 0–44)
AST: 28 IU/L (ref 0–40)
Albumin/Globulin Ratio: 1.5 (ref 1.2–2.2)
Albumin: 4.4 g/dL (ref 3.8–4.9)
Alkaline Phosphatase: 106 IU/L (ref 44–121)
BUN/Creatinine Ratio: 8 — ABNORMAL LOW (ref 9–20)
BUN: 9 mg/dL (ref 6–24)
Bilirubin Total: 0.7 mg/dL (ref 0.0–1.2)
CO2: 23 mmol/L (ref 20–29)
Calcium: 10.1 mg/dL (ref 8.7–10.2)
Chloride: 105 mmol/L (ref 96–106)
Creatinine, Ser: 1.08 mg/dL (ref 0.76–1.27)
Globulin, Total: 2.9 g/dL (ref 1.5–4.5)
Glucose: 90 mg/dL (ref 70–99)
Potassium: 4.8 mmol/L (ref 3.5–5.2)
Sodium: 144 mmol/L (ref 134–144)
Total Protein: 7.3 g/dL (ref 6.0–8.5)
eGFR: 83 mL/min/{1.73_m2} (ref >59)

## 2021-06-30 LAB — HEMOGLOBIN A1C
Est. average glucose Bld gHb Est-mCnc: 126 mg/dL
Hgb A1c MFr Bld: 6 % — ABNORMAL HIGH (ref 4.8–5.6)

## 2021-06-30 LAB — LIPID PANEL
Chol/HDL Ratio: 5.2 ratio — ABNORMAL HIGH (ref 0.0–5.0)
Cholesterol, Total: 170 mg/dL (ref 100–199)
HDL: 33 mg/dL — ABNORMAL LOW (ref >39)
LDL Chol Calc (NIH): 105 mg/dL — ABNORMAL HIGH (ref 0–99)
Triglycerides: 183 mg/dL — ABNORMAL HIGH (ref 0–149)
VLDL Cholesterol Cal: 32 mg/dL (ref 5–40)

## 2021-07-02 LAB — SPECIMEN STATUS REPORT

## 2021-07-02 LAB — CULTURE, GROUP A STREP: Strep A Culture: NEGATIVE

## 2021-07-03 ENCOUNTER — Encounter: Payer: Self-pay | Admitting: Family Medicine

## 2021-07-04 ENCOUNTER — Encounter: Payer: Self-pay | Admitting: Family Medicine

## 2021-07-26 ENCOUNTER — Other Ambulatory Visit: Payer: Self-pay | Admitting: Family Medicine

## 2021-07-27 ENCOUNTER — Encounter: Payer: Self-pay | Admitting: Family Medicine

## 2021-10-02 ENCOUNTER — Ambulatory Visit (INDEPENDENT_AMBULATORY_CARE_PROVIDER_SITE_OTHER): Payer: BC Managed Care – PPO | Admitting: Family Medicine

## 2021-10-02 ENCOUNTER — Encounter: Payer: Self-pay | Admitting: Family Medicine

## 2021-10-02 VITALS — BP 148/92 | HR 77 | Temp 98.6°F | Resp 16 | Ht 72.0 in | Wt 296.0 lb

## 2021-10-02 DIAGNOSIS — E559 Vitamin D deficiency, unspecified: Secondary | ICD-10-CM | POA: Diagnosis not present

## 2021-10-02 DIAGNOSIS — I10 Essential (primary) hypertension: Secondary | ICD-10-CM | POA: Diagnosis not present

## 2021-10-02 DIAGNOSIS — Z0000 Encounter for general adult medical examination without abnormal findings: Secondary | ICD-10-CM | POA: Diagnosis not present

## 2021-10-02 DIAGNOSIS — R739 Hyperglycemia, unspecified: Secondary | ICD-10-CM

## 2021-10-02 DIAGNOSIS — Z125 Encounter for screening for malignant neoplasm of prostate: Secondary | ICD-10-CM | POA: Diagnosis not present

## 2021-10-02 DIAGNOSIS — Z86010 Personal history of colonic polyps: Secondary | ICD-10-CM | POA: Diagnosis not present

## 2021-10-02 NOTE — Progress Notes (Signed)
I,Roshena L Chambers,acting as a scribe for Lelon Huh, MD.,have documented all relevant documentation on the behalf of Lelon Huh, MD,as directed by  Lelon Huh, MD while in the presence of Lelon Huh, MD.   Complete physical exam   Patient: Travis Abbott   DOB: [birth year]   51 y.o. Male  MRN: [id number] Visit Date: 10/02/2021  Today's healthcare provider: Lelon Huh, MD   Chief Complaint  Patient presents with   Annual Exam   Hypertension   Subjective    Travis Abbott is a 51 y.o. male who presents today for a complete physical exam.  He reports consuming a general diet. The patient does not participate in regular exercise at present. He generally feels fairly well. He reports sleeping fairly well. He does not have additional problems to discuss today.  HPI  Hypertension, follow-up  BP Readings from Last 3 Encounters:  10/02/21 (!) 148/92  06/29/21 (!) 166/102  08/30/20 (!) 143/90   Wt Readings from Last 3 Encounters:  10/02/21 296 lb (134.3 kg)  06/29/21 (!) 302 lb 4.8 oz (137.1 kg)  08/30/20 278 lb 9.6 oz (126.4 kg)     He was last seen for hypertension 3 months ago (seen by Tally Joe, PA-C).  BP at that visit was 166/102. Management since that visit includes adding HCTZ 51m daily.  He reports poor compliance with treatment. Patient did not start HCTZ . He is not having side effects.  He is following a Regular diet. He is not exercising. He does not smoke.  Use of agents associated with hypertension: none.   Outside blood pressures are averaging 152-177/ 91-101. Symptoms: No chest pain No chest pressure  No palpitations No syncope  No dyspnea No orthopnea  No paroxysmal nocturnal dyspnea No lower extremity edema   Pertinent labs Lab Results  Component Value Date   CHOL 170 06/29/2021   HDL 33 (L) 06/29/2021   LDLCALC 105 (H) 06/29/2021   TRIG 183 (H) 06/29/2021   CHOLHDL 5.2 (H) 06/29/2021   Lab Results  Component Value Date    NA 144 06/29/2021   K 4.8 06/29/2021   CREATININE 1.08 06/29/2021   EGFR 83 06/29/2021   GLUCOSE 90 06/29/2021   TSH 1.350 06/19/2020     The 10-year ASCVD risk score (Arnett DK, et al., 2019) is: 7.7%  ---------------------------------------------------------------------------------------------------   Past Medical History:  Diagnosis Date   Abscess of scrotum 05/24/2013   COVID-19 05/2020   Past Surgical History:  Procedure Laterality Date   ABSCESS DRAINAGE  2013   [facility]  Biopsy of left parotid mass  2008   Benign   CATARACT EXTRACTION     Left eye: 03/02/2001.  Right eye: 04/07/2000   Social History   Socioeconomic History   Marital status: Single    Spouse name: Not on file   Number of children: 1   Years of education: Not on file   Highest education level: Not on file  Occupational History   Occupation: Welder  Tobacco Use   Smoking status: Former    Packs/day: 1.00    Years: 10.00    Pack years: 10.00    Types: Cigarettes    Quit date: 05/21/2007    Years since quitting: 14.3   Smokeless tobacco: Former  Substance and Sexual Activity   Alcohol use: Yes    Alcohol/week: 0.0 standard drinks    Comment: drinks 2 drinks per month  Drug use: No   Sexual activity: Not on file  Other Topics Concern   Not on file  Social History Narrative   Not on file   Social Determinants of Health   Financial Resource Strain: Not on file  Food Insecurity: Not on file  Transportation Needs: Not on file  Physical Activity: Not on file  Stress: Not on file  Social Connections: Not on file  Intimate Partner Violence: Not on file   Family Status  Relation Name Status   Mother  Alive   Father  Alive   Brother  Alive   Son  Alive   MGM  Deceased   MGF  Deceased   PGM  Deceased       died from complication of Dementia   PGF  Deceased   Mat Uncle  Deceased at age late 40s       colon cancer   Family History  Problem Relation  Age of Onset   Hypertension Mother    Skin cancer Father    Stomach cancer Maternal Grandmother    Heart attack Maternal Grandfather    Dementia Paternal Grandmother    Kidney failure Paternal Grandfather    Allergies  Allergen Reactions   Losartan Potassium     headaches    Patient Care Team: Birdie Sons, MD as PCP - General (Family Medicine) Bary Castilla, Forest Gleason, MD (General Surgery) Ralene Bathe, MD (Dermatology)   Medications: Outpatient Medications Prior to Visit  Medication Sig   benzoyl peroxide-erythromycin (BENZAMYCIN) gel Apply topically 2 (two) times daily.   candesartan (ATACAND) 16 MG tablet TAKE 1 TABLET BY MOUTH ONCE DAILY (PLEASE  SCHEDULE  AN  OFFICE  VISIT  BEFORE  ANYMORE  REFILLS)   Cholecalciferol (VITAMIN D3) 5000 units TABS Take 4,000 Units by mouth daily.    EQ ASPIRIN LOW DOSE PO Take 1 tablet by mouth.   fluticasone (FLONASE) 50 MCG/ACT nasal spray Place 2 sprays into both nostrils daily. Reported on 09/18/2015   Imiquimod 3.75 % CREA Apply to affected areas 3-7 days a week as tolerated until clear if patient notices new spots.   indomethacin (INDOCIN) 50 MG capsule Take 1 capsule (50 mg total) by mouth 3 (three) times daily as needed.   loratadine (CLARITIN) 10 MG tablet Take 10 mg by mouth daily.   podofilox (CONDYLOX) 0.5 % gel Apply topically at bedtime.   psyllium (REGULOID) 0.52 G capsule Take 2 capsules by mouth daily.   sildenafil (VIAGRA) 100 MG tablet TAKE 1/2 TO 1 (ONE-HALF TO ONE) TABLET BY MOUTH ONCE DAILY AS NEEDED FOR ERECTILE DYSFUNCTION   terbinafine (LAMISIL) 250 MG tablet Take 1 tablet (250 mg total) by mouth daily.   valACYclovir (VALTREX) 1000 MG tablet TAKE 1 TABLET BY MOUTH TWICE A DAY   hydrochlorothiazide (HYDRODIURIL) 25 MG tablet Take 1 tablet (25 mg total) by mouth daily. (Patient not taking: Reported on 10/02/2021)   meloxicam (MOBIC) 15 MG tablet TAKE 1 TABLET BY MOUTH EVERY DAY AS NEEDED FOR PAIN (Patient not taking:  Reported on 10/02/2021)   [DISCONTINUED] predniSONE (STERAPRED UNI-PAK 21 TAB) 10 MG (21) TBPK tablet Take with food, morning preferred. (Patient not taking: Reported on 10/02/2021)   No facility-administered medications prior to visit.    Review of Systems  Constitutional:  Negative for appetite change, chills, fatigue and fever.  HENT:  Negative for congestion, ear pain, hearing loss, nosebleeds and trouble swallowing.   Eyes:  Negative for pain and visual disturbance.  Respiratory:  Negative for cough, chest tightness and shortness of breath.   Cardiovascular:  Negative for chest pain, palpitations and leg swelling.  Gastrointestinal:  Negative for abdominal pain, blood in stool, constipation, diarrhea, nausea and vomiting.  Endocrine: Negative for polydipsia, polyphagia and polyuria.  Genitourinary:  Negative for dysuria and flank pain.  Musculoskeletal:  Negative for arthralgias, back pain, joint swelling, myalgias and neck stiffness.  Skin:  Negative for color change, rash and wound.  Neurological:  Positive for numbness (in feet). Negative for dizziness, tremors, seizures, speech difficulty, weakness, light-headedness and headaches.  Psychiatric/Behavioral:  Negative for behavioral problems, confusion, decreased concentration, dysphoric mood and sleep disturbance. The patient is not nervous/anxious.   All other systems reviewed and are negative.    Objective     BP (!) 148/92 (BP Location: Left Arm, Patient Position: Sitting, Cuff Size: Large)   Pulse 77   Temp 98.6 F (37 C) (Oral)   Resp 16   Ht 6' (1.829 m)   Wt 296 lb (134.3 kg)   SpO2 98% Comment: room air  BMI 40.14 kg/m    Today's Vitals   10/02/21 1453 10/02/21 1457  BP: (!) 159/93 (!) 148/92  Pulse: 77   Resp: 16   Temp: 98.6 F (37 C)   TempSrc: Oral   SpO2: 98%   Weight: 296 lb (134.3 kg)   Height: 6' (1.829 m)    Body mass index is 40.14 kg/m.    Physical Exam   General Appearance:    Obese  male. Alert, cooperative, in no acute distress, appears stated age  Head:    Normocephalic, without obvious abnormality, atraumatic  Eyes:    PERRL, conjunctiva/corneas clear, EOM's intact, fundi    benign, both eyes       Ears:    Normal TM's and external ear canals, both ears  Nose:   Nares normal, septum midline, mucosa normal, no drainage   or sinus tenderness  Throat:   Lips, mucosa, and tongue normal; teeth and gums normal  Neck:   Supple, symmetrical, trachea midline, no adenopathy;       thyroid:  No enlargement/tenderness/nodules; no carotid   bruit or JVD  Back:     Symmetric, no curvature, ROM normal, no CVA tenderness  Lungs:     Clear to auscultation bilaterally, respirations unlabored  Chest wall:    No tenderness or deformity  Heart:    Normal heart rate. Normal rhythm. No murmurs, rubs, or gallops.  S1 and S2 normal  Abdomen:     Soft, non-tender, bowel sounds active all four quadrants,    no masses, no organomegaly  Genitalia:    deferred  Rectal:    deferred  Extremities:   All extremities are intact. No cyanosis or edema  Pulses:   2+ and symmetric all extremities  Skin:   Skin color, texture, turgor normal, no rashes or lesions  Lymph nodes:   Cervical, supraclavicular, and axillary nodes normal  Neurologic:   CNII-XII intact. Normal strength, sensation and reflexes      throughout     Last depression screening scores    10/02/2021    2:55 PM 07/25/2020    8:11 AM 06/19/2020    9:11 AM  PHQ 2/9 Scores  PHQ - 2 Score 0 0 1  PHQ- 9 Score 2 0 5   Last fall risk screening    10/02/2021    2:55 PM  Fall Risk   Falls in the past year? 0  Number falls in past yr: 0  Injury with Fall? 0  Risk for fall due to : No Fall Risks  Follow up Falls evaluation completed   Last Audit-C alcohol use screening    10/02/2021    2:55 PM  Alcohol Use Disorder Test (AUDIT)  1. How often do you have a drink containing alcohol? 1  2. How many drinks containing alcohol do you  have on a typical day when you are drinking? 1  3. How often do you have six or more drinks on one occasion? 1  AUDIT-C Score 3   A score of 3 or more in women, and 4 or more in men indicates increased risk for alcohol abuse, EXCEPT if all of the points are from question 1   No results found for any visits on 10/02/21.  Assessment & Plan    Routine Health Maintenance and Physical Exam  Exercise Activities and Dietary recommendations  Goals   None     Immunization History  Administered Date(s) Administered   Influenza,inj,Quad PF,6+ Mos 04/12/2015, 06/19/2020   Tdap 03/28/2009, 05/03/2019   Zoster Recombinat (Shingrix) 05/03/2019, 10/22/2019    Health Maintenance  Topic Date Due   COVID-19 Vaccine (1) Never done   HIV Screening  Never done   COLONOSCOPY (Pts 45-47yr Insurance coverage will need to be confirmed)  09/17/2021   TETANUS/TDAP  05/02/2029   Hepatitis C Screening  Completed   Zoster Vaccines- Shingrix  Completed   HPV VACCINES  Aged Out   INFLUENZA VACCINE  Discontinued    Discussed health benefits of physical activity, and encouraged him to engage in regular exercise appropriate for his age and condition.   1. Annual physical exam   2. Personal history of colonic polyps Small polyp removed by Dr. ETiffany Kocher5 years ago. Counseled regarding most recent guidelines recommending follow up colonoscopy after 7-10 years.   3. Essential (primary) hypertension Uncontrolled, has not start hctz prescribed at last visit.  - EKG 12-Lead - TSH - Renal function panel  Consider doubling atacand or adding hctz after reviewing labs results   [place]. [place] improving diet and exercise.   5. Prostate cancer screening  - PSA Total (Reflex To Free)     The entirety of the information documented in the History of Present Illness, Review of Systems and Physical Exam were personally obtained by me. Portions of this information were initially documented by  the CMA and reviewed by me for thoroughness and accuracy.     DLelon Huh MD  [phone](phone) [phone](fax)  [place]

## 2021-10-03 LAB — RENAL FUNCTION PANEL
Albumin: 4.6 g/dL (ref 3.8–4.9)
BUN/Creatinine Ratio: 10 (ref 9–20)
BUN: 12 mg/dL (ref 6–24)
CO2: 22 mmol/L (ref 20–29)
Calcium: 10 mg/dL (ref 8.7–10.2)
Chloride: 100 mmol/L (ref 96–106)
Creatinine, Ser: 1.21 mg/dL (ref 0.76–1.27)
Glucose: 87 mg/dL (ref 70–99)
Phosphorus: 3.8 mg/dL (ref 2.8–4.1)
Potassium: 4.8 mmol/L (ref 3.5–5.2)
Sodium: 138 mmol/L (ref 134–144)
eGFR: 72 mL/min/{1.73_m2} (ref >59)

## 2021-10-03 LAB — PSA TOTAL (REFLEX TO FREE): Prostate Specific Ag, Serum: 0.4 ng/mL (ref 0.0–4.0)

## 2021-10-03 LAB — TSH: TSH: 1.16 u[IU]/mL (ref 0.450–4.500)

## 2021-10-10 ENCOUNTER — Other Ambulatory Visit: Payer: Self-pay | Admitting: Family Medicine

## 2021-10-10 DIAGNOSIS — I10 Essential (primary) hypertension: Secondary | ICD-10-CM

## 2021-10-10 MED ORDER — HYDROCHLOROTHIAZIDE 12.5 MG PO TABS: 12.5000 mg | ORAL_TABLET | Freq: Every day | ORAL | 3 refills | Status: DC

## 2021-10-18 ENCOUNTER — Other Ambulatory Visit: Payer: Self-pay | Admitting: Family Medicine

## 2021-10-18 NOTE — Telephone Encounter (Signed)
Medication Refill - Medication: candesartan (ATACAND) 32 MG tablet  Has the patient contacted their pharmacy? No. No, more refills.   (Agent: If no, request that the patient contact the pharmacy for the refill. If patient does not wish to   Preferred Pharmacy (with phone number or street name):  [place], [place] - [address] [place] [address]  Phone: [phone] Fax: [phone]  Hours: Not open 24 hours   Has the patient been seen for an appointment in the last year OR does the patient have an upcoming appointment? Yes.    Agent: Please be advised that RX refills may take up to 3 business days. We ask that you follow-up with your pharmacy.

## 2021-10-19 MED ORDER — CANDESARTAN CILEXETIL 16 MG PO TABS: ORAL_TABLET | ORAL | 0 refills | Status: DC

## 2021-10-19 NOTE — Telephone Encounter (Signed)
Requested Prescriptions  Pending Prescriptions Disp Refills  . candesartan (ATACAND) 16 MG tablet 30 tablet 0    Sig: TAKE 1 TABLET BY MOUTH ONCE DAILY     Cardiovascular:  Angiotensin Receptor Blockers - candesartan cilexetil Failed - 10/18/2021 11:20 AM      Failed - Last BP in normal range    BP Readings from Last 1 Encounters:  10/02/21 (!) 148/92         Passed - K in normal range and within 180 days    Potassium  Date Value Ref Range Status  10/02/2021 4.8 3.5 - 5.2 mmol/L Final  12/07/2013 4.1 3.5 - 5.1 mmol/L Final         Passed - Cr in normal range and within 180 days    Creatinine  Date Value Ref Range Status  12/07/2013 1.28 0.60 - 1.30 mg/dL Final   Creatinine, Ser  Date Value Ref Range Status  10/02/2021 1.21 0.76 - 1.27 mg/dL Final         Passed - AST in normal range and within 360 days    AST  Date Value Ref Range Status  06/29/2021 28 0 - 40 IU/L Final   SGOT(AST)  Date Value Ref Range Status  12/07/2013 25 15 - 37 Unit/L Final         Passed - ALT in normal range and within 360 days    ALT  Date Value Ref Range Status  06/29/2021 29 0 - 44 IU/L Final   SGPT (ALT)  Date Value Ref Range Status  12/07/2013 33 12 - 78 U/L Final         Passed - Patient is not pregnant      Passed - Valid encounter within last 6 months    Recent Outpatient Visits          2 weeks ago Annual physical exam   [facility] Birdie Sons, MD   3 months ago Viral pharyngitis   [facility] Gwyneth Sprout, FNP   1 year ago Elevated transaminase level   [facility] Birdie Sons, MD   1 year ago Contusion of right great toe without damage to nail, initial encounter   [facility] Birdie Sons, MD   1 year ago Annual physical exam   [facility] Birdie Sons, MD      Future Appointments            In 1 month Fisher, Kirstie Peri, MD [facility] [place]

## 2021-10-22 ENCOUNTER — Telehealth: Payer: Self-pay | Admitting: Family Medicine

## 2021-10-22 NOTE — Telephone Encounter (Signed)
Since his potassium was borderline high, I decided to just add the hctz instead. Candesartan causes potassium levels to go up, but hctz brings potassium levels down.   This will probably get BP down to where it needs to be.

## 2021-10-22 NOTE — Telephone Encounter (Signed)
Patient understood on his physical exam that you were going to increased his Candasartin to 32 mg. a day but the 16 mg. were called in.   If he is into increase this, please send a new RX into [facility] on [address].

## 2021-10-22 NOTE — Telephone Encounter (Signed)
Patient advised and verbalized understanding 

## 2021-11-06 ENCOUNTER — Encounter: Payer: Self-pay | Admitting: Family Medicine

## 2021-11-06 ENCOUNTER — Ambulatory Visit (INDEPENDENT_AMBULATORY_CARE_PROVIDER_SITE_OTHER): Payer: BC Managed Care – PPO | Admitting: Family Medicine

## 2021-11-06 VITALS — BP 140/86 | HR 89 | Temp 97.8°F | Resp 16 | Wt 294.2 lb

## 2021-11-06 DIAGNOSIS — H1032 Unspecified acute conjunctivitis, left eye: Secondary | ICD-10-CM | POA: Diagnosis not present

## 2021-11-06 MED ORDER — CIPROFLOXACIN HCL 0.3 % OP SOLN: 1.0000 [drp] | Freq: Four times a day (QID) | OPHTHALMIC | 0 refills | Status: AC

## 2021-11-06 NOTE — Progress Notes (Signed)
     I,Jana Robinson,acting as a scribe for Lelon Huh, MD.,have documented all relevant documentation on the behalf of Lelon Huh, MD,as directed by  Lelon Huh, MD while in the presence of Lelon Huh, MD.   Established patient visit   Patient: Travis Abbott   DOB: 10/25/1970   51 y.o. Male  MRN: [id number] Visit Date: 11/06/2021  Today's healthcare provider: Lelon Huh, MD   Chief Complaint  Patient presents with   Eye Problem   Subjective    Asia Favata is a 51 yr old male presenting for left eye redness, burning, itching.  Onset 2 days ago.   Used OTC stye cream which eased the discomfort. No known foreign object.     Medications: Outpatient Medications Prior to Visit  Medication Sig   benzoyl peroxide-erythromycin (BENZAMYCIN) gel Apply topically 2 (two) times daily.   candesartan (ATACAND) 16 MG tablet TAKE 1 TABLET BY MOUTH ONCE DAILY   Cholecalciferol (VITAMIN D3) 5000 units TABS Take 4,000 Units by mouth daily.    EQ ASPIRIN LOW DOSE PO Take 1 tablet by mouth.   fluticasone (FLONASE) 50 MCG/ACT nasal spray Place 2 sprays into both nostrils daily. Reported on 09/18/2015   hydrochlorothiazide (HYDRODIURIL) 12.5 MG tablet Take 1 tablet (12.5 mg total) by mouth daily.   Imiquimod 3.75 % CREA Apply to affected areas 3-7 days a week as tolerated until clear if patient notices new spots.   indomethacin (INDOCIN) 50 MG capsule Take 1 capsule (50 mg total) by mouth 3 (three) times daily as needed.   loratadine (CLARITIN) 10 MG tablet Take 10 mg by mouth daily.   meloxicam (MOBIC) 15 MG tablet TAKE 1 TABLET BY MOUTH EVERY DAY AS NEEDED FOR PAIN   podofilox (CONDYLOX) 0.5 % gel Apply topically at bedtime.   psyllium (REGULOID) 0.52 G capsule Take 2 capsules by mouth daily.   sildenafil (VIAGRA) 100 MG tablet TAKE 1/2 TO 1 (ONE-HALF TO ONE) TABLET BY MOUTH ONCE DAILY AS NEEDED FOR ERECTILE DYSFUNCTION   valACYclovir (VALTREX) 1000 MG tablet TAKE 1 TABLET BY MOUTH  TWICE A DAY   terbinafine (LAMISIL) 250 MG tablet Take 1 tablet (250 mg total) by mouth daily.   No facility-administered medications prior to visit.    Review of Systems     Objective    BP 140/86 (BP Location: Right Arm, Patient Position: Sitting, Cuff Size: Large)   Pulse 89   Temp 97.8 F (36.6 C) (Oral)   Resp 16   Wt 294 lb 3.2 oz (133.4 kg)   SpO2 98%   BMI 39.90 kg/m    Physical Exam  Conjunctiva of left upper and lower eyelids injected with clear discharge.   Assessment & Plan     1. Acute conjunctivitis of left eye, unspecified acute conjunctivitis type  - ciprofloxacin (CILOXAN) 0.3 % ophthalmic solution; Place 1 drop into both eyes 4 (four) times daily for 7 days.  Dispense: 5 mL; Refill: 0       The entirety of the information documented in the History of Present Illness, Review of Systems and Physical Exam were personally obtained by me. Portions of this information were initially documented by the CMA and reviewed by me for thoroughness and accuracy.     Lelon Huh, MD  [facility] [phone] (phone) [phone] (fax)  [place]

## 2021-11-13 ENCOUNTER — Other Ambulatory Visit: Payer: Self-pay | Admitting: Family Medicine

## 2021-11-14 NOTE — Telephone Encounter (Signed)
Requested Prescriptions  Pending Prescriptions Disp Refills  . candesartan (ATACAND) 16 MG tablet [Pharmacy Med Name: Candesartan Cilexetil 16 MG Oral Tablet] 90 tablet 0    Sig: Take 1 tablet by mouth once daily     Cardiovascular:  Angiotensin Receptor Blockers - candesartan cilexetil Failed - 11/13/2021  8:52 PM      Failed - Last BP in normal range    BP Readings from Last 1 Encounters:  11/06/21 140/86         Passed - K in normal range and within 180 days    Potassium  Date Value Ref Range Status  10/02/2021 4.8 3.5 - 5.2 mmol/L Final  12/07/2013 4.1 3.5 - 5.1 mmol/L Final         Passed - Cr in normal range and within 180 days    Creatinine  Date Value Ref Range Status  12/07/2013 1.28 0.60 - 1.30 mg/dL Final   Creatinine, Ser  Date Value Ref Range Status  10/02/2021 1.21 0.76 - 1.27 mg/dL Final         Passed - AST in normal range and within 360 days    AST  Date Value Ref Range Status  06/29/2021 28 0 - 40 IU/L Final   SGOT(AST)  Date Value Ref Range Status  12/07/2013 25 15 - 37 Unit/L Final         Passed - ALT in normal range and within 360 days    ALT  Date Value Ref Range Status  06/29/2021 29 0 - 44 IU/L Final   SGPT (ALT)  Date Value Ref Range Status  12/07/2013 33 12 - 78 U/L Final         Passed - Patient is not pregnant      Passed - Valid encounter within last 6 months    Recent Outpatient Visits          1 week ago Acute conjunctivitis of left eye, unspecified acute conjunctivitis type   [facility] Birdie Sons, MD   1 month ago Annual physical exam   [facility] Birdie Sons, MD   4 months ago Viral pharyngitis   [facility] Gwyneth Sprout, FNP   1 year ago Elevated transaminase level   [facility] Birdie Sons, MD   1 year ago Contusion of right great toe without damage to nail, initial encounter   [facility] Birdie Sons, MD      Future  Appointments            In 1 week Fisher, Kirstie Peri, MD [facility] [place]

## 2021-11-21 ENCOUNTER — Ambulatory Visit (INDEPENDENT_AMBULATORY_CARE_PROVIDER_SITE_OTHER): Payer: BC Managed Care – PPO | Admitting: Family Medicine

## 2021-11-21 ENCOUNTER — Encounter: Payer: Self-pay | Admitting: Family Medicine

## 2021-11-21 VITALS — BP 138/93 | HR 87 | Temp 98.0°F | Resp 14 | Wt 292.0 lb

## 2021-11-21 DIAGNOSIS — I10 Essential (primary) hypertension: Secondary | ICD-10-CM

## 2021-11-21 NOTE — Progress Notes (Signed)
I,Roshena L Chambers,acting as a scribe for Lelon Huh, MD.,have documented all relevant documentation on the behalf of Lelon Huh, MD,as directed by  Lelon Huh, MD while in the presence of Lelon Huh, MD.    Established patient visit   Patient: Travis Abbott   DOB: [birth year]   51 y.o. Male  MRN: [id number] Visit Date: 11/21/2021  Today's healthcare provider: Lelon Huh, MD   Chief Complaint  Patient presents with   Hypertension   Subjective    HPI  Hypertension, follow-up  BP Readings from Last 3 Encounters:  11/21/21 (!) 138/93  11/06/21 140/86  10/02/21 (!) 148/92   Wt Readings from Last 3 Encounters:  11/21/21 292 lb (132.5 kg)  11/06/21 294 lb 3.2 oz (133.4 kg)  10/02/21 296 lb (134.3 kg)     He was last seen for hypertension on 10/02/2021.   BP at that visit was 148/92. Management since that visit includes added HCTZ 12.72m daily due to elevated potassium level.  He reports good compliance with treatment. He is not having side effects.  He is following a Regular diet. He is not exercising. He does not smoke.  Use of agents associated with hypertension: NSAIDS.   Outside blood pressures are not checked. Symptoms: No chest pain No chest pressure  No palpitations No syncope  No dyspnea No orthopnea  No paroxysmal nocturnal dyspnea No lower extremity edema   Pertinent labs Lab Results  Component Value Date   CHOL 170 06/29/2021   HDL 33 (L) 06/29/2021   LDLCALC 105 (H) 06/29/2021   TRIG 183 (H) 06/29/2021   CHOLHDL 5.2 (H) 06/29/2021   Lab Results  Component Value Date   NA 138 10/02/2021   K 4.8 10/02/2021   CREATININE 1.21 10/02/2021   EGFR 72 10/02/2021   GLUCOSE 87 10/02/2021   TSH 1.160 10/02/2021     The 10-year ASCVD risk score (Arnett DK, et al., 2019) is: 6.8%  ---------------------------------------------------------------------------------------------------   Medications: Outpatient Medications Prior to Visit   Medication Sig   benzoyl peroxide-erythromycin (BENZAMYCIN) gel Apply topically 2 (two) times daily.   candesartan (ATACAND) 16 MG tablet Take 1 tablet by mouth once daily   Cholecalciferol (VITAMIN D3) 5000 units TABS Take 4,000 Units by mouth daily.    EQ ASPIRIN LOW DOSE PO Take 1 tablet by mouth.   fluticasone (FLONASE) 50 MCG/ACT nasal spray Place 2 sprays into both nostrils daily. Reported on 09/18/2015   hydrochlorothiazide (HYDRODIURIL) 12.5 MG tablet Take 1 tablet (12.5 mg total) by mouth daily.   Imiquimod 3.75 % CREA Apply to affected areas 3-7 days a week as tolerated until clear if patient notices new spots.   indomethacin (INDOCIN) 50 MG capsule Take 1 capsule (50 mg total) by mouth 3 (three) times daily as needed.   loratadine (CLARITIN) 10 MG tablet Take 10 mg by mouth daily.   meloxicam (MOBIC) 15 MG tablet TAKE 1 TABLET BY MOUTH EVERY DAY AS NEEDED FOR PAIN   podofilox (CONDYLOX) 0.5 % gel Apply topically at bedtime.   psyllium (REGULOID) 0.52 G capsule Take 2 capsules by mouth daily.   sildenafil (VIAGRA) 100 MG tablet TAKE 1/2 TO 1 (ONE-HALF TO ONE) TABLET BY MOUTH ONCE DAILY AS NEEDED FOR ERECTILE DYSFUNCTION   terbinafine (LAMISIL) 250 MG tablet Take 1 tablet (250 mg total) by mouth daily.   valACYclovir (VALTREX) 1000 MG tablet TAKE 1 TABLET BY MOUTH TWICE A DAY   No facility-administered medications prior to visit.  Review of Systems  Constitutional:  Negative for appetite change, chills and fever.  Respiratory:  Negative for chest tightness, shortness of breath and wheezing.   Cardiovascular:  Negative for chest pain and palpitations.  Gastrointestinal:  Negative for abdominal pain, nausea and vomiting.       Objective    BP (!) 138/93 (BP Location: Right Arm, Patient Position: Sitting, Cuff Size: Large)   Pulse 87   Temp 98 F (36.7 C) (Oral)   Resp 14   Wt 292 lb (132.5 kg)   SpO2 98% Comment: room air  BMI 39.60 kg/m   Today's Vitals   11/21/21  0828 11/21/21 0836  BP: (!) 153/106 (!) 138/93  Pulse: 90 87  Resp: 14   Temp: 98 F (36.7 C)   TempSrc: Oral   SpO2: 98%   Weight: 292 lb (132.5 kg)    Body mass index is 39.6 kg/m.   Physical Exam   General appearance: Obese male, cooperative and in no acute distress Head: Normocephalic, without obvious abnormality, atraumatic Respiratory: Respirations even and unlabored, normal respiratory rate Extremities: All extremities are intact.  Skin: Skin color, texture, turgor normal. No rashes seen  Psych: Appropriate mood and affect. Neurologic: Mental status: Alert, oriented to person, place, and time, thought content appropriate.   Assessment & Plan     1. Primary hypertension Tolerating initiation of hctz with modest improvement in blood pressure. Would like to increase candesartan to 76m if potassium is not elevated.   - Renal function panel   He just had 90 x 116mcandesartan filled last week. Consider taking 2 tablets daily after reviewing electrolytes.       The entirety of the information documented in the History of Present Illness, Review of Systems and Physical Exam were personally obtained by me. Portions of this information were initially documented by the CMA and reviewed by me for thoroughness and accuracy.     DoLelon HuhMD  [phone]) [phone])  [place]

## 2021-11-22 LAB — RENAL FUNCTION PANEL
Albumin: 4.5 g/dL (ref 3.8–4.9)
BUN/Creatinine Ratio: 14 (ref 9–20)
BUN: 18 mg/dL (ref 6–24)
CO2: 23 mmol/L (ref 20–29)
Calcium: 10 mg/dL (ref 8.7–10.2)
Chloride: 102 mmol/L (ref 96–106)
Creatinine, Ser: 1.32 mg/dL — ABNORMAL HIGH (ref 0.76–1.27)
Glucose: 109 mg/dL — ABNORMAL HIGH (ref 70–99)
Phosphorus: 3.5 mg/dL (ref 2.8–4.1)
Potassium: 5.2 mmol/L (ref 3.5–5.2)
Sodium: 141 mmol/L (ref 134–144)
eGFR: 65 mL/min/{1.73_m2} (ref >59)

## 2021-11-29 ENCOUNTER — Other Ambulatory Visit: Payer: Self-pay | Admitting: Family Medicine

## 2021-11-29 DIAGNOSIS — I10 Essential (primary) hypertension: Secondary | ICD-10-CM

## 2021-11-29 MED ORDER — HYDROCHLOROTHIAZIDE 12.5 MG PO TABS: 12.5000 mg | ORAL_TABLET | Freq: Every day | ORAL | 4 refills | Status: DC

## 2021-12-21 ENCOUNTER — Other Ambulatory Visit: Payer: Self-pay | Admitting: Family Medicine

## 2021-12-21 DIAGNOSIS — L709 Acne, unspecified: Secondary | ICD-10-CM

## 2021-12-21 MED ORDER — BENZOYL PEROXIDE-ERYTHROMYCIN 5-3 % EX GEL: Freq: Two times a day (BID) | CUTANEOUS | 3 refills | Status: DC

## 2021-12-21 NOTE — Telephone Encounter (Signed)
Requested Prescriptions  Pending Prescriptions Disp Refills  . benzoyl peroxide-erythromycin (BENZAMYCIN) gel 46.6 g 3    Sig: Apply topically 2 (two) times daily.     Dermatology:  Acne preparations Passed - 12/21/2021  1:53 PM      Passed - Valid encounter within last 12 months    Recent Outpatient Visits          1 month ago Primary hypertension   [facility] Birdie Sons, MD   1 month ago Acute conjunctivitis of left eye, unspecified acute conjunctivitis type   [facility] Birdie Sons, MD   2 months ago Annual physical exam   [facility] Birdie Sons, MD   5 months ago Viral pharyngitis   [facility] Gwyneth Sprout, FNP   1 year ago Elevated transaminase level   [facility] Birdie Sons, MD      Future Appointments            In 1 month Fisher, Kirstie Peri, MD [facility], [place]

## 2021-12-21 NOTE — Telephone Encounter (Signed)
Medication Refill - Medication: benzoyl peroxide-erythromycin (BENZAMYCIN) gel  Has the patient contacted their pharmacy? No. No, more refills.   (Agent: If no, request that the patient contact the pharmacy for the refill. If patient does not wish to contact the pharmacy document the reason why and proceed with request.)   Preferred Pharmacy (with phone number or street name):  [place], [place] - [address]  [address] [place] [address]  Phone: [phone] Fax: [phone]  Hours: Not open 24 hours   Has the patient been seen for an appointment in the last year OR does the patient have an upcoming appointment? Yes.    Agent: Please be advised that RX refills may take up to 3 business days. We ask that you follow-up with your pharmacy.

## 2022-01-28 ENCOUNTER — Other Ambulatory Visit: Payer: Self-pay | Admitting: Family Medicine

## 2022-01-28 MED ORDER — CANDESARTAN CILEXETIL 16 MG PO TABS: 16.0000 mg | ORAL_TABLET | Freq: Every day | ORAL | 3 refills | Status: DC

## 2022-02-18 ENCOUNTER — Encounter: Payer: Self-pay | Admitting: Family Medicine

## 2022-02-18 ENCOUNTER — Ambulatory Visit (INDEPENDENT_AMBULATORY_CARE_PROVIDER_SITE_OTHER): Payer: BC Managed Care – PPO | Admitting: Family Medicine

## 2022-02-18 VITALS — BP 163/103 | HR 83 | Temp 98.2°F | Resp 16 | Wt 286.0 lb

## 2022-02-18 DIAGNOSIS — H1032 Unspecified acute conjunctivitis, left eye: Secondary | ICD-10-CM | POA: Diagnosis not present

## 2022-02-18 DIAGNOSIS — I10 Essential (primary) hypertension: Secondary | ICD-10-CM

## 2022-02-18 DIAGNOSIS — B351 Tinea unguium: Secondary | ICD-10-CM | POA: Diagnosis not present

## 2022-02-18 MED ORDER — HYDROCHLOROTHIAZIDE 25 MG PO TABS: 25.0000 mg | ORAL_TABLET | Freq: Every day | ORAL | 1 refills | Status: DC

## 2022-02-18 MED ORDER — TERBINAFINE HCL 250 MG PO TABS: 250.0000 mg | ORAL_TABLET | Freq: Every day | ORAL | 1 refills | Status: AC

## 2022-02-18 NOTE — Patient Instructions (Signed)
.   Please review the attached list of medications and notify my office if there are any errors.   . Please bring all of your medications to every appointment so we can make sure that our medication list is the same as yours.   

## 2022-02-18 NOTE — Progress Notes (Signed)
I,Roshena L Chambers,acting as a scribe for Lelon Huh, MD.,have documented all relevant documentation on the behalf of Lelon Huh, MD,as directed by  Lelon Huh, MD while in the presence of Lelon Huh, MD.   Established patient visit   Patient: Travis Abbott   DOB: [birth year]   51 y.o. Male  MRN: [id number] Visit Date: 02/18/2022  Today's healthcare provider: Lelon Huh, MD   Chief Complaint  Patient presents with   Hypertension   Subjective    HPI   Hypertension, follow-up  BP Readings from Last 3 Encounters:  02/18/22 (!) 163/103  11/21/21 (!) 138/93  11/06/21 140/86   Wt Readings from Last 3 Encounters:  02/18/22 286 lb (129.7 kg)  11/21/21 292 lb (132.5 kg)  11/06/21 294 lb 3.2 oz (133.4 kg)     He was last seen for hypertension 3 months ago.  BP at that visit was 138/93. Management since that visit includes ordering labs which showed potassium is a little high at 5.2. Patient was advised to continue current dose of candesartan and hctz for and follow up in 3-4 months. .  He reports good compliance with treatment. He is not having side effects.  Patient reports that he has been under more stress lately due a recent pay cut on his job.  He is not exercising. He does not smoke.  Use of agents associated with hypertension: none.   Outside blood pressures are not checked. Symptoms: No chest pain No chest pressure  No palpitations No syncope  No dyspnea No orthopnea  No paroxysmal nocturnal dyspnea No lower extremity edema   Pertinent labs Lab Results  Component Value Date   CHOL 170 06/29/2021   HDL 33 (L) 06/29/2021   LDLCALC 105 (H) 06/29/2021   TRIG 183 (H) 06/29/2021   CHOLHDL 5.2 (H) 06/29/2021   Lab Results  Component Value Date   NA 141 11/21/2021   K 5.2 11/21/2021   CREATININE 1.32 (H) 11/21/2021   EGFR 65 11/21/2021   GLUCOSE 109 (H) 11/21/2021   TSH 1.160 10/02/2021     The 10-year ASCVD risk score (Arnett DK, et al.,  2019) is: 9.1%  ---------------------------------------------------------------------------------------------------   Medications: Outpatient Medications Prior to Visit  Medication Sig   benzoyl peroxide-erythromycin (BENZAMYCIN) gel Apply topically 2 (two) times daily.   candesartan (ATACAND) 16 MG tablet Take 1 tablet (16 mg total) by mouth daily.   Cholecalciferol (VITAMIN D3) 5000 units TABS Take 4,000 Units by mouth daily.    EQ ASPIRIN LOW DOSE PO Take 1 tablet by mouth.   fluticasone (FLONASE) 50 MCG/ACT nasal spray Place 2 sprays into both nostrils daily. Reported on 09/18/2015   hydrochlorothiazide (HYDRODIURIL) 12.5 MG tablet Take 1 tablet (12.5 mg total) by mouth daily.   Imiquimod 3.75 % CREA Apply to affected areas 3-7 days a week as tolerated until clear if patient notices new spots.   indomethacin (INDOCIN) 50 MG capsule Take 1 capsule (50 mg total) by mouth 3 (three) times daily as needed.   loratadine (CLARITIN) 10 MG tablet Take 10 mg by mouth daily.   meloxicam (MOBIC) 15 MG tablet TAKE 1 TABLET BY MOUTH EVERY DAY AS NEEDED FOR PAIN   podofilox (CONDYLOX) 0.5 % gel Apply topically at bedtime.   psyllium (REGULOID) 0.52 G capsule Take 2 capsules by mouth daily.   sildenafil (VIAGRA) 100 MG tablet TAKE 1/2 TO 1 (ONE-HALF TO ONE) TABLET BY MOUTH ONCE DAILY AS NEEDED FOR ERECTILE DYSFUNCTION  terbinafine (LAMISIL) 250 MG tablet Take 1 tablet (250 mg total) by mouth daily.   valACYclovir (VALTREX) 1000 MG tablet TAKE 1 TABLET BY MOUTH TWICE A DAY   No facility-administered medications prior to visit.    Review of Systems  Constitutional:  Negative for appetite change, chills and fever.  Respiratory:  Negative for chest tightness, shortness of breath and wheezing.   Cardiovascular:  Negative for chest pain and palpitations.  Gastrointestinal:  Negative for abdominal pain, nausea and vomiting.  Psychiatric/Behavioral:  The patient is nervous/anxious.        Objective     BP (!) 163/103 (BP Location: Left Arm, Patient Position: Sitting, Cuff Size: Large)   Pulse 83   Temp 98.2 F (36.8 C) (Oral)   Resp 16   Wt 286 lb (129.7 kg)   SpO2 99% Comment: room air  BMI 38.79 kg/m   Today's Vitals   02/18/22 0812 02/18/22 0816  BP: (!) 154/95 (!) 163/103  Pulse: 83   Resp: 16   Temp: 98.2 F (36.8 C)   TempSrc: Oral   SpO2: 99%   Weight: 286 lb (129.7 kg)    Body mass index is 38.79 kg/m.    Physical Exam   General appearance: Obese male, cooperative and in no acute distress Head: Normocephalic, without obvious abnormality, atraumatic. Left lateral eye injected with small chalazion of lower inner eyelid.  Respiratory: Respirations even and unlabored, normal respiratory rate Extremities: All extremities are intact.  Skin: Skin color, texture, turgor normal. No rashes seen. Left great toenail missing with underlying yellow flaking bed with dried blood. Marland Kitchen   Psych: Appropriate mood and affect. Neurologic: Mental status: Alert, oriented to person, place, and time, thought content appropriate.   Assessment & Plan     1. Essential (primary) hypertension Increase from 12.75m to  hydrochlorothiazide (HYDRODIURIL) 25 MG tablet; Take 1 tablet (25 mg total) by mouth daily.  Dispense: 90 tablet; Refill: 1  2. Onychomycosis of left  great toe Had identical findings on right great toe last year. That nail fell off after a few days of terbinafine and pristine new nail grew in over course of a few months - terbinafine (LAMISIL) 250 MG tablet; Take 1 tablet (250 mg total) by mouth daily.  Dispense: 30 tablet; Refill: 1  3. Acute conjunctivitis of left eye, unspecified acute conjunctivitis type He still has antibiotic eye drops from June which he is going to restart. He can call for refill if he runs out. Recommend he see his eye doctor if recurs again   Follow up in 3 months.         The entirety of the information documented in the History of Present  Illness, Review of Systems and Physical Exam were personally obtained by me. Portions of this information were initially documented by the CMA and reviewed by me for thoroughness and accuracy.     DLelon Huh MD  [phone](phone) [phone](fax)  [place]

## 2022-03-10 ENCOUNTER — Other Ambulatory Visit: Payer: Self-pay | Admitting: Family Medicine

## 2022-05-31 ENCOUNTER — Other Ambulatory Visit: Payer: Self-pay | Admitting: Family Medicine

## 2022-05-31 DIAGNOSIS — M7551 Bursitis of right shoulder: Secondary | ICD-10-CM

## 2022-05-31 NOTE — Telephone Encounter (Signed)
Medication Refill - Medication: meloxicam (MOBIC) 15 MG tablet   Has two left   Has the patient contacted their pharmacy? Yes.   (Agent: If no, request that the patient contact the pharmacy for the refill. If patient does not wish to contact the pharmacy document the reason why and proceed with request.) (Agent: If yes, when and what did the pharmacy advise?)  Preferred Pharmacy (with phone number or street name):  [place], [place] - [address]  [address] [place] [address]  Phone: [phone] Fax: [phone]   Has the patient been seen for an appointment in the last year OR does the patient have an upcoming appointment? Yes.    Agent: Please be advised that RX refills may take up to 3 business days. We ask that you follow-up with your pharmacy.

## 2022-05-31 NOTE — Telephone Encounter (Signed)
Requested medication (s) are due for refill today: yes  Requested medication (s) are on the active medication list: yes  Last refill:  12/24/19  Future visit scheduled: no  Notes to clinic:  Unable to refill per protocol due to failed labs, no updated results.    Requested Prescriptions  Pending Prescriptions Disp Refills   meloxicam (MOBIC) 15 MG tablet 30 tablet 5    Sig: TAKE 1 TABLET BY MOUTH EVERY DAY AS NEEDED FOR PAIN     Analgesics:  COX2 Inhibitors Failed - 05/31/2022  5:34 PM      Failed - Manual Review: Labs are only required if the patient has taken medication for more than 8 weeks.      Failed - HGB in normal range and within 360 days    Hemoglobin  Date Value Ref Range Status  06/19/2020 17.9 (H) 13.0 - 17.7 g/dL Final         Failed - Cr in normal range and within 360 days    Creatinine  Date Value Ref Range Status  12/07/2013 1.28 0.60 - 1.30 mg/dL Final   Creatinine, Ser  Date Value Ref Range Status  11/21/2021 1.32 (H) 0.76 - 1.27 mg/dL Final         Failed - HCT in normal range and within 360 days    Hematocrit  Date Value Ref Range Status  06/19/2020 52.3 (H) 37.5 - 51.0 % Final         Passed - AST in normal range and within 360 days    AST  Date Value Ref Range Status  06/29/2021 28 0 - 40 IU/L Final   SGOT(AST)  Date Value Ref Range Status  12/07/2013 25 15 - 37 Unit/L Final         Passed - ALT in normal range and within 360 days    ALT  Date Value Ref Range Status  06/29/2021 29 0 - 44 IU/L Final   SGPT (ALT)  Date Value Ref Range Status  12/07/2013 33 12 - 78 U/L Final         Passed - eGFR is 30 or above and within 360 days    EGFR (African American)  Date Value Ref Range Status  12/07/2013 >60  Final   GFR calc Af Amer  Date Value Ref Range Status  06/19/2020 77 >59 mL/min/1.73 Final    Comment:    **In accordance with recommendations from the NKF-ASN Task force,**   [facility] is in the process of updating its eGFR  calculation to the   2021 CKD-EPI creatinine equation that estimates kidney function   without a race variable.    EGFR (Non-African Amer.)  Date Value Ref Range Status  12/07/2013 >60  Final    Comment:    eGFR values <59m/min/1.73 m2 may be an indication of chronic kidney disease (CKD). Calculated eGFR is useful in patients with stable renal function. The eGFR calculation will not be reliable in acutely ill patients when serum creatinine is changing rapidly. It is not useful in  patients on dialysis. The eGFR calculation may not be applicable to patients at the low and high extremes of body sizes, pregnant women, and vegetarians.    GFR calc non Af Amer  Date Value Ref Range Status  06/19/2020 67 >59 mL/min/1.73 Final   eGFR  Date Value Ref Range Status  11/21/2021 65 >59 mL/min/1.73 Final         Passed - Patient is not pregnant  Passed - Valid encounter within last 12 months    Recent Outpatient Visits           3 months ago Essential (primary) hypertension   [facility] Birdie Sons, MD   6 months ago Primary hypertension   [facility] Birdie Sons, MD   6 months ago Acute conjunctivitis of left eye, unspecified acute conjunctivitis type   [facility] Birdie Sons, MD   8 months ago Annual physical exam   [facility] Birdie Sons, MD   11 months ago Viral pharyngitis   [facility] Gwyneth Sprout, FNP

## 2022-06-04 MED ORDER — MELOXICAM 15 MG PO TABS: ORAL_TABLET | ORAL | 5 refills | Status: DC

## 2022-08-13 ENCOUNTER — Other Ambulatory Visit: Payer: Self-pay | Admitting: Family Medicine

## 2022-08-13 DIAGNOSIS — B001 Herpesviral vesicular dermatitis: Secondary | ICD-10-CM

## 2022-08-13 NOTE — Telephone Encounter (Signed)
Copied from [place] [id number]. Topic: General - Other >> Aug 13, 2022  9:31 AM Everette C wrote: Reason for CRM: Medication Refill - Medication: valACYclovir (VALTREX) 1000 MG tablet [id number]  Has the patient contacted their pharmacy? No. (Agent: If no, request that the patient contact the pharmacy for the refill. If patient does not wish to contact the pharmacy document the reason why and proceed with request.) (Agent: If yes, when and what did the pharmacy advise?)  Preferred Pharmacy (with phone number or street name): [place], [place] - [address] [place] [address] Phone: [phone] Fax: [phone] Hours: Not open 24 hours   Has the patient been seen for an appointment in the last year OR does the patient have an upcoming appointment? Yes.    Agent: Please be advised that RX refills may take up to 3 business days. We ask that you follow-up with your pharmacy.   The patient has stressed the urgency of their request

## 2022-08-14 MED ORDER — VALACYCLOVIR HCL 1 G PO TABS: 1000.0000 mg | ORAL_TABLET | Freq: Two times a day (BID) | ORAL | 0 refills | Status: DC

## 2022-08-14 NOTE — Telephone Encounter (Signed)
Requested Prescriptions  Pending Prescriptions Disp Refills   valACYclovir (VALTREX) 1000 MG tablet 180 tablet 0    Sig: Take 1 tablet (1,000 mg total) by mouth 2 (two) times daily.     Antimicrobials:  Antiviral Agents - Anti-Herpetic Passed - 08/13/2022 10:29 AM      Passed - Valid encounter within last 12 months    Recent Outpatient Visits           5 months ago Essential (primary) hypertension   [place], Donald E, MD   8 months ago Primary hypertension   [place], Donald E, MD   9 months ago Acute conjunctivitis of left eye, unspecified acute conjunctivitis type   [facility] Birdie Sons, MD   10 months ago Annual physical exam   [facility] Birdie Sons, MD   1 year ago Viral pharyngitis   [place] Gwyneth Sprout, [place]

## 2022-09-03 ENCOUNTER — Other Ambulatory Visit: Payer: Self-pay | Admitting: Family Medicine

## 2022-09-03 DIAGNOSIS — I10 Essential (primary) hypertension: Secondary | ICD-10-CM

## 2022-09-25 NOTE — Progress Notes (Unsigned)
      I,Sha'taria Tyson,acting as a Neurosurgeon for [employer], PA-C.,have documented all relevant documentation on the behalf of Debera Lat, PA-C,as directed by  [employer], PA-C while in the presence of [employer], PA-C.  Established patient visit   Patient: Travis Abbott   DOB: Aug 10, 1970   52 y.o. Male  MRN: [id number] Visit Date: 09/26/2022  Today's healthcare provider: Debera Lat, PA-C   No chief complaint on file.  Subjective    Cough This is a new problem. The current episode started in the past 7 days. Associated symptoms include nasal congestion.   ***  Medications: Outpatient Medications Prior to Visit  Medication Sig  . benzoyl peroxide-erythromycin (BENZAMYCIN) gel Apply topically 2 (two) times daily.  . candesartan (ATACAND) 16 MG tablet Take 1 tablet (16 mg total) by mouth daily.  . Cholecalciferol (VITAMIN D3) 5000 units TABS Take 4,000 Units by mouth daily.   Burman Blacksmith ASPIRIN LOW DOSE PO Take 1 tablet by mouth.  . fluticasone (FLONASE) 50 MCG/ACT nasal spray Place 2 sprays into both nostrils daily. Reported on 09/18/2015  . hydrochlorothiazide (HYDRODIURIL) 25 MG tablet Take 1 tablet by mouth once daily  . Imiquimod 3.75 % CREA Apply to affected areas 3-7 days a week as tolerated until clear if patient notices new spots.  . indomethacin (INDOCIN) 50 MG capsule Take 1 capsule by mouth three times daily as needed  . loratadine (CLARITIN) 10 MG tablet Take 10 mg by mouth daily.  . meloxicam (MOBIC) 15 MG tablet TAKE 1 TABLET BY MOUTH EVERY DAY AS NEEDED FOR PAIN  . podofilox (CONDYLOX) 0.5 % gel Apply topically at bedtime.  . psyllium (REGULOID) 0.52 G capsule Take 2 capsules by mouth daily.  . sildenafil (VIAGRA) 100 MG tablet TAKE 1/2 TO 1 (ONE-HALF TO ONE) TABLET BY MOUTH ONCE DAILY AS NEEDED FOR ERECTILE DYSFUNCTION  . terbinafine (LAMISIL) 250 MG tablet Take 1 tablet (250 mg total) by mouth daily.  . valACYclovir (VALTREX) 1000 MG tablet Take 1 tablet  (1,000 mg total) by mouth 2 (two) times daily.   No facility-administered medications prior to visit.    Review of Systems  Respiratory:  Positive for cough.    {Labs  Heme  Chem  Endocrine  Serology  Results Review (optional):23779}   Objective    There were no vitals taken for this visit. {Show previous vital signs (optional):23777}  Physical Exam  ***  No results found for any visits on 09/26/22.  Assessment & Plan     ***  No follow-ups on file.      {provider attestation***:1}   Debera Lat, PA-C  [facility] [phone] (phone) [phone] (fax)  [facility]

## 2022-09-26 ENCOUNTER — Ambulatory Visit: Payer: BC Managed Care – PPO | Admitting: Family Medicine

## 2022-09-26 ENCOUNTER — Encounter: Payer: Self-pay | Admitting: Family Medicine

## 2022-09-26 VITALS — BP 133/91 | HR 109 | Temp 98.6°F | Wt 284.0 lb

## 2022-09-26 DIAGNOSIS — J208 Acute bronchitis due to other specified organisms: Secondary | ICD-10-CM

## 2022-09-26 DIAGNOSIS — J069 Acute upper respiratory infection, unspecified: Secondary | ICD-10-CM | POA: Diagnosis not present

## 2022-09-26 DIAGNOSIS — J301 Allergic rhinitis due to pollen: Secondary | ICD-10-CM | POA: Diagnosis not present

## 2022-09-26 DIAGNOSIS — R051 Acute cough: Secondary | ICD-10-CM | POA: Diagnosis not present

## 2022-09-26 DIAGNOSIS — R0981 Nasal congestion: Secondary | ICD-10-CM

## 2022-09-26 LAB — POC COVID19 BINAXNOW: SARS Coronavirus 2 Ag: NEGATIVE

## 2022-09-26 LAB — POC INFLUENZA A&B (BINAX/QUICKVUE)
Influenza A, POC: NEGATIVE
Influenza B, POC: NEGATIVE

## 2022-09-26 MED ORDER — BENZONATATE 100 MG PO CAPS: 100.0000 mg | ORAL_CAPSULE | Freq: Two times a day (BID) | ORAL | 0 refills | Status: DC | PRN

## 2022-09-26 NOTE — Patient Instructions (Addendum)
You can  use chloraseptic spray over-the-counter.  Can also use Cepacol Extra Strength if your throat becomes worse or cannot be managed effectively with your [place] lozenges.

## 2022-10-01 ENCOUNTER — Encounter: Payer: Self-pay | Admitting: Family Medicine

## 2022-10-01 ENCOUNTER — Other Ambulatory Visit: Payer: Self-pay | Admitting: Family Medicine

## 2022-10-01 DIAGNOSIS — B9689 Other specified bacterial agents as the cause of diseases classified elsewhere: Secondary | ICD-10-CM

## 2022-10-01 MED ORDER — AZITHROMYCIN 250 MG PO TABS: ORAL_TABLET | ORAL | 0 refills | Status: DC

## 2022-11-04 NOTE — Progress Notes (Unsigned)
I,Sulibeya S Dimas,acting as a scribe for Mila Merry, MD.,have documented all relevant documentation on the behalf of Mila Merry, MD,as directed by  Mila Merry, MD    Complete physical exam   Patient: Travis Abbott   DOB: 01-09-1971   52 y.o. Male  MRN: [id number] Visit Date: 11/05/2022  Today's healthcare provider: Mila Merry, MD   Chief Complaint  Patient presents with   Annual Exam   Subjective    Travis Abbott is a 52 y.o. male who presents today for a complete physical exam.  He reports consuming a general diet. The patient does not participate in regular exercise at present. He generally feels well. He reports sleeping well. He does have additional problems to discuss today.  HPI  Patient C/O bump near rectal area x 2 weeks. He denies any pain, itching or blood.   Patient reports alopecia on right side of head for the last two months. He had similar episode years ago in the back of his head attributed to stress which resolved after a few months. He did have death in the family a few months ago. Hair is now starting to grown out but is white compared to surrounding mostly brown hair.   He also has long history of idiopathic neuropathy in his feet for which he saw Dr. Sherryll Burger several years ago. He has been taking OTC alpha-lipoic acid intermittently which helps. He also has history of vitamin D deficiency and had been taking vitamin D supplements, but not taken for several months now. He has also read to lyme disease can cause neuropathy and would like to be tested.   He also has history of ED on sildenafil which is effective, but he would like to have testosterone levels checked.   Lab Results  Component Value Date   VD25OH 41.8 06/19/2020     Past Medical History:  Diagnosis Date   Abscess of scrotum 05/24/2013   COVID-19 05/2020   Past Surgical History:  Procedure Laterality Date   ABSCESS DRAINAGE  2013   APPENDECTOMY  12/07/13   Dr. Lemar Livings, [facility]    Biopsy of left parotid mass  2008   Benign   CATARACT EXTRACTION     Left eye: 03/02/2001.  Right eye: 04/07/2000   Social History   Socioeconomic History   Marital status: Single    Spouse name: Not on file   Number of children: 1   Years of education: Not on file   Highest education level: Not on file  Occupational History   Occupation: Welder  Tobacco Use   Smoking status: Former    Packs/day: 1.00    Years: 10.00    Additional pack years: 0.00    Total pack years: 10.00    Types: Cigarettes    Quit date: 05/21/2007    Years since quitting: 15.4   Smokeless tobacco: Former  Substance and Sexual Activity   Alcohol use: Yes    Alcohol/week: 0.0 standard drinks of alcohol    Comment: drinks 2 drinks per month   Drug use: No   Sexual activity: Not on file  Other Topics Concern   Not on file  Social History Narrative   Not on file   Social Determinants of Health   Financial Resource Strain: Not on file  Food Insecurity: Not on file  Transportation Needs: Not on file  Physical Activity: Not on file  Stress: Not on file  Social Connections: Not on file  Intimate Partner Violence: Not  on file   Family Status  Relation Name Status   Mother  Alive   Father  Alive   Brother  Alive   Son  Alive   MGM  Deceased   MGF  Deceased   PGM  Deceased       died from complication of Dementia   PGF  Deceased   Mat Uncle  Deceased at age late 64s       colon cancer   Family History  Problem Relation Age of Onset   Hypertension Mother    Skin cancer Father    Stomach cancer Maternal Grandmother    Heart attack Maternal Grandfather    Dementia Paternal Grandmother    Kidney failure Paternal Grandfather    Allergies  Allergen Reactions   Losartan Potassium     headaches    Patient Care Team: Malva Limes, MD as PCP - General (Family Medicine) Lemar Livings, Merrily Pew, MD (General Surgery) Deirdre Evener, MD (Dermatology)   Medications: Outpatient Medications  Prior to Visit  Medication Sig   Alpha lipoic acid 600mg  tab Take 1 tablet daily as needed for neuropathy   candesartan (ATACAND) 16 MG tablet Take 1 tablet (16 mg total) by mouth daily.   EQ ASPIRIN LOW DOSE PO Take 1 tablet by mouth.   hydrochlorothiazide (HYDRODIURIL) 25 MG tablet Take 1 tablet by mouth once daily   loratadine (CLARITIN) 10 MG tablet Take 10 mg by mouth daily.   meloxicam (MOBIC) 15 MG tablet TAKE 1 TABLET BY MOUTH EVERY DAY AS NEEDED FOR PAIN   psyllium (REGULOID) 0.52 G capsule Take 2 capsules by mouth daily.   terbinafine (LAMISIL) 250 MG tablet Take 1 tablet (250 mg total) by mouth daily.   valACYclovir (VALTREX) 1000 MG tablet Take 1 tablet (1,000 mg total) by mouth 2 (two) times daily.   [DISCONTINUED] benzoyl peroxide-erythromycin (BENZAMYCIN) gel Apply topically 2 (two) times daily.   [DISCONTINUED] sildenafil (VIAGRA) 100 MG tablet TAKE 1/2 TO 1 (ONE-HALF TO ONE) TABLET BY MOUTH ONCE DAILY AS NEEDED FOR ERECTILE DYSFUNCTION   Cholecalciferol (VITAMIN D3) 5000 units TABS Take 4,000 Units by mouth daily.  (Patient not taking: Reported on 11/05/2022)   fluticasone (FLONASE) 50 MCG/ACT nasal spray Place 2 sprays into both nostrils daily. Reported on 09/18/2015 (Patient not taking: Reported on 11/05/2022)   Imiquimod 3.75 % CREA Apply to affected areas 3-7 days a week as tolerated until clear if patient notices new spots. (Patient not taking: Reported on 11/05/2022)   indomethacin (INDOCIN) 50 MG capsule Take 1 capsule by mouth three times daily as needed (Patient not taking: Reported on 11/05/2022)   podofilox (CONDYLOX) 0.5 % gel Apply topically at bedtime. (Patient not taking: Reported on 11/05/2022)   [DISCONTINUED] azithromycin (ZITHROMAX) 250 MG tablet Take 500mg  PO daily x1d and then 250mg  daily x4 days (Patient not taking: Reported on 11/05/2022)   [DISCONTINUED] benzonatate (TESSALON) 100 MG capsule Take 1 capsule (100 mg total) by mouth 2 (two) times daily as needed for  cough. (Patient not taking: Reported on 11/05/2022)   No facility-administered medications prior to visit.    Review of Systems  Skin:  Positive for rash.  All other systems reviewed and are negative.     Objective    BP 127/88 (BP Location: Left Arm, Patient Position: Sitting, Cuff Size: Large)   Pulse 78   Temp 98.2 F (36.8 C) (Temporal)   Resp 12   Ht 6' (1.829 m)   Wt 287 lb (  130.2 kg)   SpO2 99%   BMI 38.92 kg/m  BP Readings from Last 3 Encounters:  11/05/22 127/88  09/26/22 (!) 133/91  02/18/22 (!) 163/103   Wt Readings from Last 3 Encounters:  11/05/22 287 lb (130.2 kg)  09/26/22 284 lb (128.8 kg)  02/18/22 286 lb (129.7 kg)    Physical Exam  General Appearance:    Obese male. Alert, cooperative, in no acute distress, appears stated age  Head:    Normocephalic, without obvious abnormality, atraumatic. About 4cm oval patch of thinned out white hair right temporal area of scalp, surrounded by thick mostly dark brown hair. No erythema or flaking of underlying skin.   Eyes:    PERRL, conjunctiva/corneas clear, EOM's intact, fundi    benign, both eyes       Ears:    Normal TM's and external ear canals, both ears  Nose:   Nares normal, septum midline, mucosa normal, no drainage   or sinus tenderness  Throat:   Lips, mucosa, and tongue normal; teeth and gums normal  Neck:   Supple, symmetrical, trachea midline, no adenopathy;       thyroid:  No enlargement/tenderness/nodules; no carotid   bruit or JVD  Back:     Symmetric, no curvature, ROM normal, no CVA tenderness  Lungs:     Clear to auscultation bilaterally, respirations unlabored  Chest wall:    No tenderness or deformity. Moderate acnea  Heart:    Bradycardic. Normal rhythm. No murmurs, rubs, or gallops.  S1 and S2 normal  Abdomen:     Soft, non-tender, bowel sounds active all four quadrants,    no masses, no organomegaly  Genitalia:    deferred  Rectal:    Several non irritated perirectal skin tags about  7mm long. No bleeding or discharge.   Extremities:   All extremities are intact. No cyanosis or edema  Pulses:   2+ and symmetric all extremities  Skin:   Skin color, texture, turgor normal, no rashes or lesions  Lymph nodes:   Cervical, supraclavicular, and axillary nodes normal  Neurologic:   CNII-XII intact. Normal strength, sensation and reflexes      throughout    Last depression screening scores    09/26/2022    8:23 AM 10/02/2021    2:55 PM 07/25/2020    8:11 AM  PHQ 2/9 Scores  PHQ - 2 Score 0 0 0  PHQ- 9 Score 1 2 0   Last fall risk screening    09/26/2022    8:23 AM  Fall Risk   Falls in the past year? 0  Number falls in past yr: 0  Injury with Fall? 0   Last Audit-C alcohol use screening    09/26/2022    8:23 AM  Alcohol Use Disorder Test (AUDIT)  1. How often do you have a drink containing alcohol? 1  2. How many drinks containing alcohol do you have on a typical day when you are drinking? 0  3. How often do you have six or more drinks on one occasion? 0  AUDIT-C Score 1   A score of 3 or more in women, and 4 or more in men indicates increased risk for alcohol abuse, EXCEPT if all of the points are from question 1   No results found for any visits on 11/05/22.  Assessment & Plan    Routine Health Maintenance and Physical Exam  Exercise Activities and Dietary recommendations  Goals   None  Immunization History  Administered Date(s) Administered   Influenza,inj,Quad PF,6+ Mos 04/12/2015, 06/19/2020   Tdap 03/28/2009, 05/03/2019   Zoster Recombinat (Shingrix) 05/03/2019, 10/22/2019    Health Maintenance  Topic Date Due   COVID-19 Vaccine (1) Never done   HIV Screening  Never done   Colonoscopy  09/18/2023   DTaP/Tdap/Td (3 - Td or Tdap) 05/02/2029   Hepatitis C Screening  Completed   Zoster Vaccines- Shingrix  Completed   HPV VACCINES  Aged Out   INFLUENZA VACCINE  Discontinued    Discussed health benefits of physical activity, and encouraged  him to engage in regular exercise appropriate for his age and condition.   2. Acne, chest refill - benzoyl peroxide-erythromycin (BENZAMYCIN) gel; Apply topically 2 (two) times daily.  Dispense: 46.6 g; Refill: 3  3. Vitamin D deficiency Currently off vitamin D supplement.  - VITAMIN D 25 Hydroxy (Vit-D Deficiency, Fractures)  4. ED (erectile dysfunction) of organic origin refill sildenafil (VIAGRA) 100 MG tablet; Take 1 tablet (100 mg total) by mouth as needed for erectile dysfunction.  Dispense: 5 tablet; Refill: 2  - Testosterone,Free and Total  5. Primary hypertension Well controlled.  Continue current medications.   - Comprehensive metabolic panel  6. Hypertriglyceridemia  - CBC - Lipid panel  7. Morbid obesity (HCC) D&E  8. Elevated serum glucose  - Hemoglobin A1c  9. Neuropathy Has had work up by Dr. Sherryll Burger, D deficiency is likely contributing factor and checking levels today. OTC alpha-lipoic acid has been helpful. He has ready lyme disease can cause neuropathy and would like to be checked. - Lyme Disease Serology w/Reflex  10. Alopecia  Similar to previous episode years ago, both episodes occurred in aftermath of stressful event. Hair is now growing back much thinner and white compared to surrounding hair. Advised to expect hair thickness to return to normal, but may remain white. No sign of underlying skin disorder.     The entirety of the information documented in the History of Present Illness, Review of Systems and Physical Exam were personally obtained by me. Portions of this information were initially documented by the CMA and reviewed by me for thoroughness and accuracy.     Mila Merry, MD  [facility] [phone] (phone) [phone] (fax)  [facility]

## 2022-11-05 ENCOUNTER — Ambulatory Visit (INDEPENDENT_AMBULATORY_CARE_PROVIDER_SITE_OTHER): Payer: BC Managed Care – PPO | Admitting: Family Medicine

## 2022-11-05 ENCOUNTER — Encounter: Payer: Self-pay | Admitting: Family Medicine

## 2022-11-05 VITALS — BP 127/88 | HR 78 | Temp 98.2°F | Resp 12 | Ht 72.0 in | Wt 287.0 lb

## 2022-11-05 DIAGNOSIS — E781 Pure hyperglyceridemia: Secondary | ICD-10-CM

## 2022-11-05 DIAGNOSIS — G629 Polyneuropathy, unspecified: Secondary | ICD-10-CM

## 2022-11-05 DIAGNOSIS — I10 Essential (primary) hypertension: Secondary | ICD-10-CM | POA: Diagnosis not present

## 2022-11-05 DIAGNOSIS — L709 Acne, unspecified: Secondary | ICD-10-CM

## 2022-11-05 DIAGNOSIS — R739 Hyperglycemia, unspecified: Secondary | ICD-10-CM | POA: Diagnosis not present

## 2022-11-05 DIAGNOSIS — E559 Vitamin D deficiency, unspecified: Secondary | ICD-10-CM

## 2022-11-05 DIAGNOSIS — N529 Male erectile dysfunction, unspecified: Secondary | ICD-10-CM

## 2022-11-05 DIAGNOSIS — Z0000 Encounter for general adult medical examination without abnormal findings: Secondary | ICD-10-CM | POA: Diagnosis not present

## 2022-11-05 DIAGNOSIS — Z125 Encounter for screening for malignant neoplasm of prostate: Secondary | ICD-10-CM | POA: Diagnosis not present

## 2022-11-05 DIAGNOSIS — L659 Nonscarring hair loss, unspecified: Secondary | ICD-10-CM

## 2022-11-05 MED ORDER — SILDENAFIL CITRATE 100 MG PO TABS: 100.0000 mg | ORAL_TABLET | ORAL | 2 refills | Status: AC | PRN

## 2022-11-05 MED ORDER — BENZOYL PEROXIDE-ERYTHROMYCIN 5-3 % EX GEL: Freq: Two times a day (BID) | CUTANEOUS | 3 refills | Status: DC

## 2022-11-06 LAB — LIPID PANEL
Chol/HDL Ratio: 4.6 ratio (ref 0.0–5.0)
Triglycerides: 176 mg/dL — ABNORMAL HIGH (ref 0–149)

## 2022-11-06 LAB — COMPREHENSIVE METABOLIC PANEL
ALT: 35 IU/L (ref 0–44)
AST: 26 IU/L (ref 0–40)
Bilirubin Total: 0.6 mg/dL (ref 0.0–1.2)
Calcium: 9.4 mg/dL (ref 8.7–10.2)
Chloride: 100 mmol/L (ref 96–106)
Creatinine, Ser: 1.22 mg/dL (ref 0.76–1.27)

## 2022-11-07 LAB — COMPREHENSIVE METABOLIC PANEL
Alkaline Phosphatase: 93 IU/L (ref 44–121)
BUN: 16 mg/dL (ref 6–24)
CO2: 25 mmol/L (ref 20–29)
Glucose: 114 mg/dL — ABNORMAL HIGH (ref 70–99)
Sodium: 140 mmol/L (ref 134–144)
eGFR: 71 mL/min/{1.73_m2} (ref >59)

## 2022-11-07 LAB — LIPID PANEL: VLDL Cholesterol Cal: 31 mg/dL (ref 5–40)

## 2022-11-08 LAB — TESTOSTERONE,FREE AND TOTAL
Testosterone, Free: 7.1 pg/mL — ABNORMAL LOW (ref 7.2–24.0)
Testosterone: 468 ng/dL (ref 264–916)

## 2022-11-08 LAB — CBC
Hematocrit: 48.6 % (ref 37.5–51.0)
Hemoglobin: 16.6 g/dL (ref 13.0–17.7)
MCH: 30.9 pg (ref 26.6–33.0)
MCHC: 34.2 g/dL (ref 31.5–35.7)
MCV: 91 fL (ref 79–97)
Platelets: 202 10*3/uL (ref 150–450)
RBC: 5.37 x10E6/uL (ref 4.14–5.80)
RDW: 12.3 % (ref 11.6–15.4)
WBC: 9.8 10*3/uL (ref 3.4–10.8)

## 2022-11-08 LAB — VITAMIN D 25 HYDROXY (VIT D DEFICIENCY, FRACTURES): Vit D, 25-Hydroxy: 25.4 ng/mL — ABNORMAL LOW (ref 30.0–100.0)

## 2022-11-08 LAB — LIPID PANEL
Cholesterol, Total: 174 mg/dL (ref 100–199)
HDL: 38 mg/dL — ABNORMAL LOW (ref >39)
LDL Chol Calc (NIH): 105 mg/dL — ABNORMAL HIGH (ref 0–99)

## 2022-11-08 LAB — COMPREHENSIVE METABOLIC PANEL
Albumin: 4.5 g/dL (ref 3.8–4.9)
BUN/Creatinine Ratio: 13 (ref 9–20)
Globulin, Total: 3.4 g/dL (ref 1.5–4.5)
Potassium: 4.8 mmol/L (ref 3.5–5.2)
Total Protein: 7.9 g/dL (ref 6.0–8.5)

## 2022-11-08 LAB — HEMOGLOBIN A1C
Est. average glucose Bld gHb Est-mCnc: 128 mg/dL
Hgb A1c MFr Bld: 6.1 % — ABNORMAL HIGH (ref 4.8–5.6)

## 2022-11-08 LAB — LYME DISEASE SEROLOGY W/REFLEX: Lyme Total Antibody EIA: NEGATIVE

## 2022-11-11 LAB — PSA: Prostate Specific Ag, Serum: 0.4 ng/mL (ref 0.0–4.0)

## 2022-11-11 LAB — SPECIMEN STATUS REPORT

## 2022-12-08 ENCOUNTER — Other Ambulatory Visit: Payer: Self-pay | Admitting: Family Medicine

## 2022-12-08 DIAGNOSIS — I10 Essential (primary) hypertension: Secondary | ICD-10-CM

## 2022-12-09 MED ORDER — HYDROCHLOROTHIAZIDE 25 MG PO TABS
25.0000 mg | ORAL_TABLET | Freq: Every day | ORAL | 3 refills | Status: DC
Start: 2022-12-09 — End: 2023-12-17

## 2022-12-10 LAB — INFORMED CONSENT NEEDED

## 2023-02-16 ENCOUNTER — Other Ambulatory Visit: Payer: Self-pay | Admitting: Family Medicine

## 2023-03-21 ENCOUNTER — Ambulatory Visit: Payer: Self-pay

## 2023-03-21 ENCOUNTER — Ambulatory Visit: Payer: BC Managed Care – PPO | Admitting: Family Medicine

## 2023-03-21 ENCOUNTER — Encounter: Payer: Self-pay | Admitting: Family Medicine

## 2023-03-21 VITALS — BP 175/94 | HR 75 | Ht 72.0 in | Wt 301.6 lb

## 2023-03-21 DIAGNOSIS — I10 Essential (primary) hypertension: Secondary | ICD-10-CM

## 2023-03-21 DIAGNOSIS — M7989 Other specified soft tissue disorders: Secondary | ICD-10-CM | POA: Insufficient documentation

## 2023-03-21 NOTE — Progress Notes (Signed)
Established patient visit   Patient: Travis Abbott   DOB: [birth year]   52 y.o. Male  MRN: [id number] Visit Date: 03/21/2023  Today's healthcare provider: Ronnald Ramp, MD   Chief Complaint  Patient presents with   Leg Swelling    Moderate and bilateral starting at knee to ankle X 3 days. Patient reports no redness, no pain and hx of htn with no previous history of the following symptoms. Patient reports it was a little better this morning and then it came back.    Subjective     HPI     Leg Swelling    Additional comments: Moderate and bilateral starting at knee to ankle X 3 days. Patient reports no redness, no pain and hx of htn with no previous history of the following symptoms. Patient reports it was a little better this morning and then it came back.       Last edited by Acey Lav, CMA on 03/21/2023 10:05 AM.       Discussed the use of AI scribe software for clinical note transcription with the patient, who gave verbal consent to proceed.  History of Present Illness   The patient, a 52 year old individual with a history of hypertension, presents with a three-day history of bilateral lower extremity edema, extending from the knees to the ankles. The patient denies associated redness or pain. The swelling is reportedly worse in the evenings and improves by morning. The patient has been adhering to his prescribed regimen of hydrochlorothiazide 25mg  daily and candesartan 16mg  daily, but reports running out of hydrochlorothiazide three days prior to the onset of the edema.  The patient denies recent illness or injury. The edema was first noticed upon waking, when the patient's shoes felt unusually tight. The swelling began in the left leg and subsequently involved the right leg. The patient also reports a change in bowel habits, with a decrease from twice daily to once daily bowel movements, but denies any changes in the appearance of the stool.  The  patient has recently transitioned to a sedentary job, which involves prolonged periods of sitting. Despite the recent onset of edema, the patient denies shortness of breath, chest pain, or any other symptoms suggestive of cardiovascular compromise. The patient's last metabolic panel, conducted in June of the previous year, was within normal limits. The patient's weight is recorded as 301 pounds, with a BMI of 40.90.  The patient's blood pressure at the time of the consultation was elevated at 175/94, despite previous readings of 127/88 and 133/91 in June and May respectively. The patient's oxygen saturation is 98% and pulse is 75. The patient's physical examination was unremarkable, with no signs of redness or tenderness in the lower extremities. The patient's lungs were clear on auscultation and no abnormalities were detected on abdominal examination.         Past Medical History:  Diagnosis Date   Abscess of scrotum 05/24/2013   COVID-19 05/2020    Medications: Outpatient Medications Prior to Visit  Medication Sig   Alpha-Lipoic Acid 600 MG TABS Take 1 tablet by mouth daily as needed.   benzoyl peroxide-erythromycin (BENZAMYCIN) gel Apply topically 2 (two) times daily.   candesartan (ATACAND) 16 MG tablet Take 1 tablet by mouth once daily   Cholecalciferol (VITAMIN D3) 5000 units TABS Take 4,000 Units by mouth daily.   EQ ASPIRIN LOW DOSE PO Take 1 tablet by mouth.   fluticasone (FLONASE) 50 MCG/ACT nasal spray Place 2 sprays into  both nostrils daily. Reported on 09/18/2015   hydrochlorothiazide (HYDRODIURIL) 25 MG tablet Take 1 tablet (25 mg total) by mouth daily.   loratadine (CLARITIN) 10 MG tablet Take 10 mg by mouth daily.   meloxicam (MOBIC) 15 MG tablet TAKE 1 TABLET BY MOUTH EVERY DAY AS NEEDED FOR PAIN   psyllium (REGULOID) 0.52 G capsule Take 2 capsules by mouth daily.   sildenafil (VIAGRA) 100 MG tablet Take 1 tablet (100 mg total) by mouth as needed for erectile dysfunction.    terbinafine (LAMISIL) 250 MG tablet Take 1 tablet (250 mg total) by mouth daily.   valACYclovir (VALTREX) 1000 MG tablet Take 1 tablet (1,000 mg total) by mouth 2 (two) times daily.   No facility-administered medications prior to visit.    Review of Systems  Last metabolic panel Lab Results  Component Value Date   GLUCOSE 114 (H) 11/05/2022   NA 140 11/05/2022   K 4.8 11/05/2022   CL 100 11/05/2022   CO2 25 11/05/2022   BUN 16 11/05/2022   CREATININE 1.22 11/05/2022   EGFR 71 11/05/2022   CALCIUM 9.4 11/05/2022   PHOS 3.5 11/21/2021   PROT 7.9 11/05/2022   ALBUMIN 4.5 11/05/2022   LABGLOB 3.4 11/05/2022   AGRATIO 1.5 06/29/2021   BILITOT 0.6 11/05/2022   ALKPHOS 93 11/05/2022   AST 26 11/05/2022   ALT 35 11/05/2022   ANIONGAP 8 12/07/2013        Objective    BP (!) 175/94 (BP Location: Left Arm, Patient Position: Sitting, Cuff Size: Large)   Pulse 75   Ht 6' (1.829 m)   Wt (!) 301 lb 9.6 oz (136.8 kg)   SpO2 98%   BMI 40.90 kg/m  BP Readings from Last 3 Encounters:  03/21/23 (!) 175/94  11/05/22 127/88  09/26/22 (!) 133/91   Wt Readings from Last 3 Encounters:  03/21/23 (!) 301 lb 9.6 oz (136.8 kg)  11/05/22 287 lb (130.2 kg)  09/26/22 284 lb (128.8 kg)          Physical Exam  Physical Exam   VITALS: P- 75, BP- 146/86, SaO2- 98% MEASUREMENTS: WT- 301 pounds, BMI- 40.9 CARD: RRR without murmur, gallops or rubs CHEST: Clear to auscultation, no crackles or wheezes EXTREMITIES: Bilateral lower extremity 2+pitting edema, no erythema, no calf tenderness, no warmth to touch        No results found for any visits on 03/21/23.  Assessment & Plan     Problem List Items Addressed This Visit       Cardiovascular and Mediastinum   Primary hypertension    Blood pressure elevated at 175/94 today, previously controlled on Candesartan 16mg  daily and Hydrochlorothiazide 25mg  daily. Chronic, BP not at goal today, improved to 146/86 on recheck -Resume  Hydrochlorothiazide 25mg  daily. -Recheck blood pressure after resumption of Hydrochlorothiazide. -continue ARB 16mg  daily  -CMP, A1c and TSH measured today       Relevant Orders   Hemoglobin A1c   TSH+T4F+T3Free   Comprehensive metabolic panel     Other   Leg swelling - Primary    New onset, non-painful, pitting edema from knees to ankles. No associated dyspnea or abdominal pain. Recent change in work environment with increased sedentary time. Noted to be off Hydrochlorothiazide for 3 days. -Resume Hydrochlorothiazide 25mg  daily. -Order complete metabolic panel, thyroid studies, and hemoglobin A1c to assess renal, liver, and glucose status.      Relevant Orders   Hemoglobin A1c   TSH+T4F+T3Free   Comprehensive metabolic  panel     RTC precautions -Advise patient to monitor for signs of worsening edema, shortness of breath, chest pain, or rapid heart rate.      Return if symptoms worsen or fail to improve.         Ronnald Ramp, MD  [facility] [phone] (phone) [phone] (fax)  [facility]

## 2023-03-21 NOTE — Assessment & Plan Note (Signed)
Blood pressure elevated at 175/94 today, previously controlled on Candesartan 16mg  daily and Hydrochlorothiazide 25mg  daily. Chronic, BP not at goal today, improved to 146/86 on recheck -Resume Hydrochlorothiazide 25mg  daily. -Recheck blood pressure after resumption of Hydrochlorothiazide. -continue ARB 16mg  daily  -CMP, A1c and TSH measured today

## 2023-03-21 NOTE — Telephone Encounter (Signed)
  Chief Complaint: Both legs moderately swollen Symptoms: above Frequency: weds Pertinent Negatives: Patient denies Pain, redness, SOB, chest pain Disposition: [] ED /[] Urgent Care (no appt availability in office) / [x] Appointment(In office/virtual)/ []  [place] [facility]/ [] Home Care/ [] Refused Recommended Disposition /[] [place] [facility]/ []  Follow-up with PCP Additional Notes: Pt states that he noticed both legs starting to swell on weds. Appt in office this morning.    Summary: Swollen L & R Leg Advice   Pt is calling to report both of his legs from his ankles to knees are swollen with no pain. Please advise     Reason for Disposition  [1] MODERATE leg swelling (e.g., swelling extends up to knees) AND [2] new-onset or worsening  Answer Assessment - Initial Assessment Questions 1. ONSET: "When did the swelling start?" (e.g., minutes, hours, days)     Weds 2. LOCATION: "What part of the leg is swollen?"  "Are both legs swollen or just one leg?"     Both legs from feet to knees 3. SEVERITY: "How bad is the swelling?" (e.g., localized; mild, moderate, severe)   - Localized: Small area of swelling localized to one leg.   - MILD pedal edema: Swelling limited to foot and ankle, pitting edema < 1/4 inch (6 mm) deep, rest and elevation eliminate most or all swelling.   - MODERATE edema: Swelling of lower leg to knee, pitting edema > 1/4 inch (6 mm) deep, rest and elevation only partially reduce swelling.   - SEVERE edema: Swelling extends above knee, facial or hand swelling present.      moderate 4. REDNESS: "Does the swelling look red or infected?"     no 5. PAIN: "Is the swelling painful to touch?" If Yes, ask: "How painful is it?"   (Scale 1-10; mild, moderate or severe)     no 6. FEVER: "Do you have a fever?" If Yes, ask: "What is it, how was it measured, and when did it start?"      no 7. CAUSE: "What do you think is causing the leg swelling?"     unsure 8. MEDICAL  HISTORY: "Do you have a history of blood clots (e.g., DVT), cancer, heart failure, kidney disease, or liver failure?"     HTN 9. RECURRENT SYMPTOM: "Have you had leg swelling before?" If Yes, ask: "When was the last time?" "What happened that time?"     no 10. OTHER SYMPTOMS: "Do you have any other symptoms?" (e.g., chest pain, difficulty breathing)       no  Protocols used: Leg Swelling and Edema-A-AH

## 2023-03-21 NOTE — Assessment & Plan Note (Signed)
New onset, non-painful, pitting edema from knees to ankles. No associated dyspnea or abdominal pain. Recent change in work environment with increased sedentary time. Noted to be off Hydrochlorothiazide for 3 days. -Resume Hydrochlorothiazide 25mg  daily. -Order complete metabolic panel, thyroid studies, and hemoglobin A1c to assess renal, liver, and glucose status.

## 2023-03-22 LAB — COMPREHENSIVE METABOLIC PANEL
ALT: 33 [IU]/L (ref 0–44)
AST: 30 [IU]/L (ref 0–40)
Albumin: 4.3 g/dL (ref 3.8–4.9)
Alkaline Phosphatase: 93 [IU]/L (ref 44–121)
BUN/Creatinine Ratio: 10 (ref 9–20)
BUN: 12 mg/dL (ref 6–24)
Bilirubin Total: 0.5 mg/dL (ref 0.0–1.2)
CO2: 22 mmol/L (ref 20–29)
Calcium: 9.2 mg/dL (ref 8.7–10.2)
Chloride: 104 mmol/L (ref 96–106)
Creatinine, Ser: 1.19 mg/dL (ref 0.76–1.27)
Globulin, Total: 3.1 g/dL (ref 1.5–4.5)
Glucose: 91 mg/dL (ref 70–99)
Potassium: 4.8 mmol/L (ref 3.5–5.2)
Sodium: 141 mmol/L (ref 134–144)
Total Protein: 7.4 g/dL (ref 6.0–8.5)
eGFR: 73 mL/min/{1.73_m2} (ref >59)

## 2023-03-22 LAB — TSH+T4F+T3FREE
Free T4: 1.32 ng/dL (ref 0.82–1.77)
T3, Free: 3.4 pg/mL (ref 2.0–4.4)
TSH: 1.29 u[IU]/mL (ref 0.450–4.500)

## 2023-03-22 LAB — HEMOGLOBIN A1C
Est. average glucose Bld gHb Est-mCnc: 126 mg/dL
Hgb A1c MFr Bld: 6 % — ABNORMAL HIGH (ref 4.8–5.6)

## 2023-05-10 ENCOUNTER — Other Ambulatory Visit: Payer: Self-pay | Admitting: Family Medicine

## 2023-05-12 NOTE — Telephone Encounter (Signed)
Requested Prescriptions  Pending Prescriptions Disp Refills   candesartan (ATACAND) 16 MG tablet [Pharmacy Med Name: Candesartan Cilexetil 16 MG Oral Tablet] 90 tablet 0    Sig: Take 1 tablet (16 mg total) by mouth daily.     Cardiovascular:  Angiotensin Receptor Blockers - candesartan cilexetil Failed - 05/12/2023  1:53 PM      Failed - Last BP in normal range    BP Readings from Last 1 Encounters:  03/21/23 (!) 175/94         Failed - Valid encounter within last 6 months    Recent Outpatient Visits           1 month ago Leg swelling   [place] [facility] White[place], Benjamin, MD   6 months ago Annual physical exam   [facility] Malva Limes, MD   7 months ago Acute viral bronchitis   [place] [facility] [place], Monico Blitz, DO   1 year ago Essential (primary) hypertension   [place] [facility] Malva Limes, MD   1 year ago Primary hypertension   [place] [facility] Malva Limes, MD              Passed - K in normal range and within 180 days    Potassium  Date Value Ref Range Status  03/21/2023 4.8 3.5 - 5.2 mmol/L Final  12/07/2013 4.1 3.5 - 5.1 mmol/L Final         Passed - Cr in normal range and within 180 days    Creatinine  Date Value Ref Range Status  12/07/2013 1.28 0.60 - 1.30 mg/dL Final   Creatinine, Ser  Date Value Ref Range Status  03/21/2023 1.19 0.76 - 1.27 mg/dL Final         Passed - AST in normal range and within 360 days    AST  Date Value Ref Range Status  03/21/2023 30 0 - 40 IU/L Final   SGOT(AST)  Date Value Ref Range Status  12/07/2013 25 15 - 37 Unit/L Final         Passed - ALT in normal range and within 360 days    ALT  Date Value Ref Range Status  03/21/2023 33 0 - 44 IU/L Final   SGPT (ALT)  Date Value Ref Range Status  12/07/2013 33 12 - 78 U/L Final         Passed - Patient is not pregnant

## 2023-08-09 ENCOUNTER — Other Ambulatory Visit: Payer: Self-pay | Admitting: Family Medicine

## 2023-09-25 ENCOUNTER — Other Ambulatory Visit: Payer: Self-pay | Admitting: Family Medicine

## 2023-09-29 NOTE — Telephone Encounter (Signed)
 Pt called in to check status of refill request, and to make sure it had been received from the pharmacy.

## 2023-11-12 ENCOUNTER — Other Ambulatory Visit: Payer: Self-pay | Admitting: Family Medicine

## 2023-11-19 ENCOUNTER — Ambulatory Visit (INDEPENDENT_AMBULATORY_CARE_PROVIDER_SITE_OTHER): Admitting: Family Medicine

## 2023-11-19 ENCOUNTER — Encounter: Payer: Self-pay | Admitting: Family Medicine

## 2023-11-19 VITALS — BP 140/96 | HR 77 | Ht 72.0 in | Wt 296.2 lb

## 2023-11-19 DIAGNOSIS — I10 Essential (primary) hypertension: Secondary | ICD-10-CM

## 2023-11-19 DIAGNOSIS — Z114 Encounter for screening for human immunodeficiency virus [HIV]: Secondary | ICD-10-CM

## 2023-11-19 DIAGNOSIS — Z860101 Personal history of adenomatous and serrated colon polyps: Secondary | ICD-10-CM

## 2023-11-19 DIAGNOSIS — Z0000 Encounter for general adult medical examination without abnormal findings: Secondary | ICD-10-CM

## 2023-11-19 DIAGNOSIS — E559 Vitamin D deficiency, unspecified: Secondary | ICD-10-CM

## 2023-11-19 DIAGNOSIS — Z125 Encounter for screening for malignant neoplasm of prostate: Secondary | ICD-10-CM

## 2023-11-19 DIAGNOSIS — R739 Hyperglycemia, unspecified: Secondary | ICD-10-CM | POA: Diagnosis not present

## 2023-11-19 DIAGNOSIS — L709 Acne, unspecified: Secondary | ICD-10-CM

## 2023-11-19 MED ORDER — BENZOYL PEROXIDE-ERYTHROMYCIN 5-3 % EX GEL: Freq: Two times a day (BID) | CUTANEOUS | 3 refills | Status: AC

## 2023-11-19 NOTE — Patient Instructions (Signed)
 Please review the attached list of medications and notify my office if there are any errors.   I recommend that you get the Hepatitis B vaccine. You can get this vaccine at most pharmacies, or schedule an appointment to get it at [facility].

## 2023-11-19 NOTE — Progress Notes (Signed)
 Complete physical exam   Patient: Travis Abbott   DOB: Oct 07, 1970   53 y.o. Male  MRN: [id number] Visit Date: 11/19/2023  Today's healthcare provider: Nancyann Perry, MD   Chief Complaint  Patient presents with   Annual Exam    Last completed 11/05/22 Diet - Less sodium, well balanced Exercise - none Feeling - well Sleeping - well Concerns - none   Care Management    Colonoscopy ok to place referral to same office at last  HIV Screening declined    Medication Refill    Patient would like a refill on his gel    Subjective    Discussed the use of AI scribe software for clinical note transcription with the patient, who gave verbal consent to proceed.  History of Present Illness   Travis Abbott is a 53 year old male who presents for an annual physical exam.  His neuropathy symptoms remain unchanged, characterized by burning sensations primarily in his feet. He manages these symptoms with alpha lipoic acid, taking the supplement as needed when the burning becomes severe.  He is currently on blood pressure medications and reports no issues with them. However, he has not been checking his blood pressure at home recently.  No recent chest pain, heart flutter, shortness of breath, or gastrointestinal issues such as stomach problems or changes in bowel habits.  He reports some hearing difficulties, which he attributes to shooting guns, but does not mention any recent eye issues, having seen an eye doctor about a year ago.     He is also due for follow up htn and prediabetes. Is interested in intermittent fasting.   Lab Results  Component Value Date   HGBA1C 6.0 (H) 03/21/2023   HGBA1C 6.1 (H) 11/05/2022   HGBA1C 6.0 (H) 06/29/2021   Lab Results  Component Value Date   NA 141 03/21/2023   K 4.8 03/21/2023   CREATININE 1.19 03/21/2023   EGFR 73 03/21/2023   GLUCOSE 91 03/21/2023     Past Medical History:  Diagnosis Date   Abscess of scrotum 05/24/2013    COVID-19 05/2020   Past Surgical History:  Procedure Laterality Date   ABSCESS DRAINAGE  2013   APPENDECTOMY  12/07/13   Dr. Dessa, [facility]   Biopsy of left parotid mass  2008   Benign   CATARACT EXTRACTION     Left eye: 03/02/2001.  Right eye: 04/07/2000   Social History   Socioeconomic History   Marital status: Single    Spouse name: Not on file   Number of children: 1   Years of education: Not on file   Highest education level: Not on file  Occupational History   Occupation: Welder  Tobacco Use   Smoking status: Former    Current packs/day: 0.00    Average packs/day: 1 pack/day for 10.0 years (10.0 ttl pk-yrs)    Types: Cigarettes    Start date: 05/20/1997    Quit date: 05/21/2007    Years since quitting: 16.5   Smokeless tobacco: Former  Substance and Sexual Activity   Alcohol use: Yes    Alcohol/week: 0.0 standard drinks of alcohol    Comment: drinks 2 drinks per month   Drug use: No   Sexual activity: Not on file  Other Topics Concern   Not on file  Social History Narrative   Not on file   Social Drivers of Health   Financial Resource Strain: Not on file  Food Insecurity: Not  on file  Transportation Needs: Not on file  Physical Activity: Not on file  Stress: Not on file  Social Connections: Not on file  Intimate Partner Violence: Not on file   Family Status  Relation Name Status   Mother  Alive   Father  Alive   Brother  Alive   Son  Alive   MGM  Deceased   MGF  Deceased   PGM  Deceased       died from complication of Dementia   PGF  Deceased   Mat Uncle  Deceased at age late 80s       colon cancer  No partnership data on file   Family History  Problem Relation Age of Onset   Hypertension Mother    Skin cancer Father    Stomach cancer Maternal Grandmother    Heart attack Maternal Grandfather    Dementia Paternal Grandmother    Kidney failure Paternal Grandfather    Allergies  Allergen Reactions   Losartan Potassium     headaches     Patient Care Team: Gasper Nancyann BRAVO, MD as PCP - General (Family Medicine) Dessa, Reyes ORN, MD (General Surgery) Hester Alm BROCKS, MD (Dermatology)   Medications: Outpatient Medications Prior to Visit  Medication Sig   Alpha-Lipoic Acid 600 MG TABS Take 1 tablet by mouth daily as needed.   candesartan  (ATACAND ) 16 MG tablet Take 1 tablet by mouth once daily   Cholecalciferol (VITAMIN D3) 5000 units TABS Take 4,000 Units by mouth daily.   EQ ASPIRIN LOW DOSE PO Take 1 tablet by mouth.   fluticasone  (FLONASE ) 50 MCG/ACT nasal spray Place 2 sprays into both nostrils daily. Reported on 09/18/2015   hydrochlorothiazide  (HYDRODIURIL ) 25 MG tablet Take 1 tablet (25 mg total) by mouth daily.   indomethacin  (INDOCIN ) 50 MG capsule Take 1 capsule by mouth three times daily as needed   loratadine (CLARITIN) 10 MG tablet Take 10 mg by mouth daily.   meloxicam  (MOBIC ) 15 MG tablet TAKE 1 TABLET BY MOUTH EVERY DAY AS NEEDED FOR PAIN   psyllium (REGULOID) 0.52 G capsule Take 2 capsules by mouth daily.   sildenafil  (VIAGRA ) 100 MG tablet Take 1 tablet (100 mg total) by mouth as needed for erectile dysfunction.   terbinafine  (LAMISIL ) 250 MG tablet Take 1 tablet (250 mg total) by mouth daily.   valACYclovir  (VALTREX ) 1000 MG tablet Take 1 tablet (1,000 mg total) by mouth 2 (two) times daily.   benzoyl peroxide -erythromycin  (BENZAMYCIN) gel Apply topically 2 (two) times daily.   No facility-administered medications prior to visit.    Review of Systems  Constitutional:  Negative for chills, diaphoresis and fever.  HENT:  Negative for congestion, ear discharge, ear pain, hearing loss, nosebleeds, sore throat and tinnitus.   Eyes:  Negative for photophobia, pain, discharge and redness.  Respiratory:  Negative for cough, shortness of breath, wheezing and stridor.   Cardiovascular:  Negative for chest pain, palpitations and leg swelling.  Gastrointestinal:  Negative for abdominal pain, blood in stool,  constipation, diarrhea, nausea and vomiting.  Endocrine: Negative for polydipsia.  Genitourinary:  Negative for dysuria, flank pain, frequency, hematuria and urgency.  Musculoskeletal:  Negative for back pain, myalgias and neck pain.  Skin:  Negative for rash.  Allergic/Immunologic: Negative for environmental allergies.  Neurological:  Negative for dizziness, tremors, seizures, weakness and headaches.  Hematological:  Does not bruise/bleed easily.  Psychiatric/Behavioral:  Negative for hallucinations and suicidal ideas. The patient is not nervous/anxious.  Objective    BP (!) 140/96 (BP Location: Left Arm, Patient Position: Sitting, Cuff Size: Large)   Pulse 77   Ht 6' (1.829 m)   Wt 296 lb 3.2 oz (134.4 kg)   SpO2 98%   BMI 40.17 kg/m    Physical Exam   General Appearance:    Severely obese male. Alert, cooperative, in no acute distress, appears stated age  Head:    Normocephalic, without obvious abnormality, atraumatic  Eyes:    PERRL, conjunctiva/corneas clear, EOM's intact, fundi    benign, both eyes       Ears:    Normal TM's and external ear canals, both ears  Nose:   Nares normal, septum midline, mucosa normal, no drainage   or sinus tenderness  Throat:   Lips, mucosa, and tongue normal; teeth and gums normal  Neck:   Supple, symmetrical, trachea midline, no adenopathy;       thyroid :  No enlargement/tenderness/nodules; no carotid   bruit or JVD  Back:     Symmetric, no curvature, ROM normal, no CVA tenderness  Lungs:     Clear to auscultation bilaterally, respirations unlabored  Chest wall:    No tenderness or deformity  Heart:    Normal heart rate. Normal rhythm. No murmurs, rubs, or gallops.  S1 and S2 normal  Abdomen:     Soft, non-tender, bowel sounds active all four quadrants,    no masses, no organomegaly  Genitalia:    deferred  Rectal:    deferred  Extremities:   All extremities are intact. No cyanosis or edema  Pulses:   2+ and symmetric all  extremities  Skin:   Skin color, texture, turgor normal, no rashes or lesions  Lymph nodes:   Cervical, supraclavicular, and axillary nodes normal  Neurologic:   CNII-XII intact. Normal strength, sensation and reflexes      throughout     Last depression screening scores    11/19/2023    8:50 AM 11/05/2022    8:22 AM 09/26/2022    8:23 AM  PHQ 2/9 Scores  PHQ - 2 Score 0 0 0  PHQ- 9 Score  2 1   Last fall risk screening    11/05/2022    8:22 AM  Fall Risk   Falls in the past year? 0  Number falls in past yr: 0  Injury with Fall? 0  Risk for fall due to : No Fall Risks  Follow up Falls evaluation completed   Last Audit-C alcohol use screening    11/05/2022    8:23 AM  Alcohol Use Disorder Test (AUDIT)  1. How often do you have a drink containing alcohol? 1  2. How many drinks containing alcohol do you have on a typical day when you are drinking? 0  3. How often do you have six or more drinks on one occasion? 1  AUDIT-C Score 2   A score of 3 or more in women, and 4 or more in men indicates increased risk for alcohol abuse, EXCEPT if all of the points are from question 1   No results found for any visits on 11/19/23.  Assessment & Plan    Routine Health Maintenance and Physical Exam  Exercise Activities and Dietary recommendations  Goals   None     Immunization History  Administered Date(s) Administered   Influenza,inj,Quad PF,6+ Mos 04/12/2015, 06/19/2020   Janssen (J&J) SARS-COV-2 Vaccination 04/18/2020   Tdap 03/28/2009, 05/03/2019   Zoster Recombinant(Shingrix) 05/03/2019, 10/22/2019  Health Maintenance  Topic Date Due   HIV Screening  Never done   Hepatitis B Vaccines (1 of 3 - 19+ 3-dose series) Never done   COVID-19 Vaccine (2 - Janssen risk series) 05/16/2020   Colonoscopy  09/18/2023   DTaP/Tdap/Td (3 - Td or Tdap) 05/02/2029   Hepatitis C Screening  Completed   Zoster Vaccines- Shingrix  Completed   HPV VACCINES  Aged Out   Meningococcal B  Vaccine  Aged Out   INFLUENZA VACCINE  Discontinued    Discussed health benefits of physical activity, and encouraged him to engage in regular exercise appropriate for his age and condition.  Advised of recommendation of hepatitis B vaccines. He declined vaccine today.    2. Primary hypertension Not at goal, consider increasing candesartan  or hydrochlorothiazide .  - CBC - Comprehensive metabolic panel with GFR - Lipid panel  3. Elevated serum glucose  - Hemoglobin A1c  4. Morbid obesity He is interested in intermittent fasting. Also advised he would be a candidate for GLP-1 agonist.   5. Prostate cancer screening  - PSA Total (Reflex To Free)  6. Acne, unspecified acne type refill benzoyl peroxide -erythromycin  (BENZAMYCIN) gel; Apply topically 2 (two) times daily.  Dispense: 46.6 g; Refill: 3  7. History of adenomatous polyp of colon  - Ambulatory referral to Gastroenterology  8. Screening for HIV (human immunodeficiency virus)  - HIV Antibody (routine testing w rflx)         Nancyann Perry, MD  [facility] [phone] (phone) [phone] (fax)  [facility]

## 2023-11-20 ENCOUNTER — Ambulatory Visit: Payer: Self-pay | Admitting: Family Medicine

## 2023-11-20 LAB — LIPID PANEL
Chol/HDL Ratio: 5.5 ratio — ABNORMAL HIGH (ref 0.0–5.0)
Cholesterol, Total: 177 mg/dL (ref 100–199)
HDL: 32 mg/dL — ABNORMAL LOW (ref >39)
LDL Chol Calc (NIH): 114 mg/dL — ABNORMAL HIGH (ref 0–99)
Triglycerides: 174 mg/dL — ABNORMAL HIGH (ref 0–149)
VLDL Cholesterol Cal: 31 mg/dL (ref 5–40)

## 2023-11-20 LAB — COMPREHENSIVE METABOLIC PANEL WITH GFR
ALT: 29 IU/L (ref 0–44)
AST: 24 IU/L (ref 0–40)
Albumin: 4.4 g/dL (ref 3.8–4.9)
Alkaline Phosphatase: 97 IU/L (ref 44–121)
BUN/Creatinine Ratio: 9 (ref 9–20)
BUN: 12 mg/dL (ref 6–24)
Bilirubin Total: 0.8 mg/dL (ref 0.0–1.2)
CO2: 22 mmol/L (ref 20–29)
Calcium: 10 mg/dL (ref 8.7–10.2)
Chloride: 99 mmol/L (ref 96–106)
Creatinine, Ser: 1.3 mg/dL — ABNORMAL HIGH (ref 0.76–1.27)
Globulin, Total: 3.2 g/dL (ref 1.5–4.5)
Glucose: 105 mg/dL — ABNORMAL HIGH (ref 70–99)
Potassium: 5.1 mmol/L (ref 3.5–5.2)
Sodium: 139 mmol/L (ref 134–144)
Total Protein: 7.6 g/dL (ref 6.0–8.5)
eGFR: 65 mL/min/{1.73_m2} (ref >59)

## 2023-11-20 LAB — CBC
Hematocrit: 49.9 % (ref 37.5–51.0)
Hemoglobin: 16.7 g/dL (ref 13.0–17.7)
MCH: 30.5 pg (ref 26.6–33.0)
MCHC: 33.5 g/dL (ref 31.5–35.7)
MCV: 91 fL (ref 79–97)
Platelets: 243 10*3/uL (ref 150–450)
RBC: 5.48 x10E6/uL (ref 4.14–5.80)
RDW: 12.4 % (ref 11.6–15.4)
WBC: 10 10*3/uL (ref 3.4–10.8)

## 2023-11-20 LAB — PSA TOTAL (REFLEX TO FREE): Prostate Specific Ag, Serum: 0.6 ng/mL (ref 0.0–4.0)

## 2023-11-20 LAB — HEMOGLOBIN A1C
Est. average glucose Bld gHb Est-mCnc: 131 mg/dL
Hgb A1c MFr Bld: 6.2 % — ABNORMAL HIGH (ref 4.8–5.6)

## 2023-11-20 LAB — HIV ANTIBODY (ROUTINE TESTING W REFLEX): HIV Screen 4th Generation wRfx: NONREACTIVE

## 2023-12-08 ENCOUNTER — Other Ambulatory Visit: Payer: Self-pay | Admitting: Family Medicine

## 2023-12-08 DIAGNOSIS — M7551 Bursitis of right shoulder: Secondary | ICD-10-CM

## 2023-12-12 ENCOUNTER — Telehealth: Payer: Self-pay

## 2023-12-12 NOTE — Telephone Encounter (Signed)
 Can you see what is going on with this referral Thanks    Copied from CRM #[id number]. Topic: Referral - Question >> Dec 12, 2023 10:17 AM [name] wrote: Reason for CRM: Patient is following up on a referral for a colonoscopy. Patient states he contacted that office and the person he spoke with says they have not received anything. On our end I show the referral as authorized/ready to schedule. Please advise.  Dates on referral show 11/19/2023-11/18/2024.   Ladell POUR [facility] [address] [place] [address] [phone]

## 2023-12-16 ENCOUNTER — Other Ambulatory Visit: Payer: Self-pay | Admitting: Family Medicine

## 2023-12-16 DIAGNOSIS — I10 Essential (primary) hypertension: Secondary | ICD-10-CM

## 2024-01-21 ENCOUNTER — Ambulatory Visit: Payer: Self-pay

## 2024-01-21 DIAGNOSIS — Z860101 Personal history of adenomatous and serrated colon polyps: Secondary | ICD-10-CM | POA: Diagnosis not present

## 2024-01-21 DIAGNOSIS — Z09 Encounter for follow-up examination after completed treatment for conditions other than malignant neoplasm: Secondary | ICD-10-CM | POA: Diagnosis present

## 2024-01-21 DIAGNOSIS — D120 Benign neoplasm of cecum: Secondary | ICD-10-CM | POA: Diagnosis not present

## 2024-02-16 ENCOUNTER — Other Ambulatory Visit: Payer: Self-pay | Admitting: Family Medicine

## 2024-05-16 ENCOUNTER — Other Ambulatory Visit: Payer: Self-pay | Admitting: Family Medicine

## 2024-05-16 DIAGNOSIS — I10 Essential (primary) hypertension: Secondary | ICD-10-CM

## 2024-06-02 ENCOUNTER — Other Ambulatory Visit: Payer: Self-pay | Admitting: Family Medicine

## 2024-06-02 DIAGNOSIS — B001 Herpesviral vesicular dermatitis: Secondary | ICD-10-CM

## 2024-06-14 ENCOUNTER — Other Ambulatory Visit: Payer: Self-pay | Admitting: Family Medicine

## 2024-06-14 DIAGNOSIS — I10 Essential (primary) hypertension: Secondary | ICD-10-CM

## 2024-07-05 ENCOUNTER — Ambulatory Visit: Admitting: Family Medicine
